# Patient Record
Sex: Female | Born: 1985 | Race: Black or African American | Hispanic: No | Marital: Single | Smoking: Never smoker
Health system: Southern US, Community
[De-identification: ages and names within clinical notes are randomized; demographics above are authoritative.]

## PROBLEM LIST (undated history)

## (undated) ENCOUNTER — Inpatient Hospital Stay (HOSPITAL_COMMUNITY): Payer: Self-pay

## (undated) DIAGNOSIS — O2340 Unspecified infection of urinary tract in pregnancy, unspecified trimester: Secondary | ICD-10-CM

## (undated) DIAGNOSIS — N83209 Unspecified ovarian cyst, unspecified side: Secondary | ICD-10-CM

## (undated) DIAGNOSIS — O24419 Gestational diabetes mellitus in pregnancy, unspecified control: Secondary | ICD-10-CM

## (undated) DIAGNOSIS — A749 Chlamydial infection, unspecified: Secondary | ICD-10-CM

## (undated) DIAGNOSIS — G43909 Migraine, unspecified, not intractable, without status migrainosus: Secondary | ICD-10-CM

## (undated) DIAGNOSIS — J302 Other seasonal allergic rhinitis: Secondary | ICD-10-CM

## (undated) DIAGNOSIS — K219 Gastro-esophageal reflux disease without esophagitis: Secondary | ICD-10-CM

## (undated) HISTORY — PX: NO PAST SURGERIES: SHX2092

## (undated) HISTORY — PX: WISDOM TOOTH EXTRACTION: SHX21

---

## 2005-08-29 ENCOUNTER — Emergency Department (HOSPITAL_COMMUNITY): Admission: EM | Admit: 2005-08-29 | Discharge: 2005-08-29 | Payer: Self-pay | Admitting: Family Medicine

## 2006-05-29 ENCOUNTER — Inpatient Hospital Stay (HOSPITAL_COMMUNITY): Admission: AD | Admit: 2006-05-29 | Discharge: 2006-05-29 | Payer: Self-pay | Admitting: Obstetrics and Gynecology

## 2006-05-30 ENCOUNTER — Inpatient Hospital Stay (HOSPITAL_COMMUNITY): Admission: AD | Admit: 2006-05-30 | Discharge: 2006-06-03 | Payer: Self-pay | Admitting: Obstetrics and Gynecology

## 2006-12-21 ENCOUNTER — Emergency Department (HOSPITAL_COMMUNITY): Admission: EM | Admit: 2006-12-21 | Discharge: 2006-12-21 | Payer: Self-pay | Admitting: Emergency Medicine

## 2007-03-02 ENCOUNTER — Emergency Department (HOSPITAL_COMMUNITY): Admission: EM | Admit: 2007-03-02 | Discharge: 2007-03-02 | Payer: Self-pay | Admitting: Family Medicine

## 2011-02-09 LAB — URINALYSIS, ROUTINE W REFLEX MICROSCOPIC
Glucose, UA: NEGATIVE
Urobilinogen, UA: 0.2

## 2011-02-09 LAB — URINE MICROSCOPIC-ADD ON

## 2011-10-14 ENCOUNTER — Inpatient Hospital Stay (HOSPITAL_COMMUNITY)
Admission: AD | Admit: 2011-10-14 | Discharge: 2011-10-14 | Disposition: A | Discharge status: Home / Self Care | Payer: Medicaid Other | Source: Ambulatory Visit | Attending: Obstetrics & Gynecology | Admitting: Obstetrics & Gynecology

## 2011-10-14 ENCOUNTER — Encounter (HOSPITAL_COMMUNITY): Payer: Self-pay

## 2011-10-14 ENCOUNTER — Inpatient Hospital Stay (HOSPITAL_COMMUNITY): Payer: Medicaid Other

## 2011-10-14 DIAGNOSIS — O468X9 Other antepartum hemorrhage, unspecified trimester: Secondary | ICD-10-CM

## 2011-10-14 DIAGNOSIS — Z3490 Encounter for supervision of normal pregnancy, unspecified, unspecified trimester: Secondary | ICD-10-CM

## 2011-10-14 DIAGNOSIS — R109 Unspecified abdominal pain: Secondary | ICD-10-CM | POA: Insufficient documentation

## 2011-10-14 DIAGNOSIS — O209 Hemorrhage in early pregnancy, unspecified: Secondary | ICD-10-CM | POA: Insufficient documentation

## 2011-10-14 DIAGNOSIS — Z1389 Encounter for screening for other disorder: Secondary | ICD-10-CM

## 2011-10-14 HISTORY — DX: Other seasonal allergic rhinitis: J30.2

## 2011-10-14 HISTORY — DX: Gastro-esophageal reflux disease without esophagitis: K21.9

## 2011-10-14 HISTORY — DX: Migraine, unspecified, not intractable, without status migrainosus: G43.909

## 2011-10-14 HISTORY — DX: Unspecified ovarian cyst, unspecified side: N83.209

## 2011-10-14 LAB — URINE MICROSCOPIC-ADD ON

## 2011-10-14 LAB — POCT PREGNANCY, URINE: Preg Test, Ur: POSITIVE — AB

## 2011-10-14 LAB — CBC
HCT: 34.9 % — ABNORMAL LOW (ref 36.0–46.0)
Hemoglobin: 11.7 g/dL — ABNORMAL LOW (ref 12.0–15.0)
MCHC: 33.5 g/dL (ref 30.0–36.0)
RBC: 4.51 MIL/uL (ref 3.87–5.11)
RDW: 15.9 % — ABNORMAL HIGH (ref 11.5–15.5)

## 2011-10-14 LAB — WET PREP, GENITAL
Clue Cells Wet Prep HPF POC: NONE SEEN
Yeast Wet Prep HPF POC: NONE SEEN

## 2011-10-14 LAB — DIFFERENTIAL
Basophils Absolute: 0 10*3/uL (ref 0.0–0.1)
Eosinophils Absolute: 0.1 10*3/uL (ref 0.0–0.7)
Lymphocytes Relative: 19 % (ref 12–46)
Lymphs Abs: 3.2 10*3/uL (ref 0.7–4.0)

## 2011-10-14 LAB — HCG, QUANTITATIVE, PREGNANCY: hCG, Beta Chain, Quant, S: 38419 m[IU]/mL — ABNORMAL HIGH (ref <5)

## 2011-10-14 LAB — ABO/RH: ABO/RH(D): AB POS

## 2011-10-14 LAB — URINALYSIS, ROUTINE W REFLEX MICROSCOPIC
Bilirubin Urine: NEGATIVE
Glucose, UA: NEGATIVE mg/dL
Ketones, ur: NEGATIVE mg/dL
Protein, ur: NEGATIVE mg/dL
Specific Gravity, Urine: 1.015 (ref 1.005–1.030)
pH: 7.5 (ref 5.0–8.0)

## 2011-10-14 NOTE — Discharge Instructions (Signed)
Vaginal Bleeding During Pregnancy A small amount of bleeding from the vagina can happen anytime during pregnancy. Be sure to tell your doctor about all vaginal bleeding.  HOME CARE  Get plenty of rest and sleep.   Count the number of pads you use each day. Do not use tampons.   Save any tissue you pass for your doctor to see.   Do not exercise   Do not do any heavy lifting.   Avoid going up and down stairs. If you must climb stairs, go slowly.   Do not have sex (intercourse) or orgasms until approved by your doctor.   Do not douche.   Only take medicine as told by your doctor. Do not take aspirin.   Eat healthy.   Always keep your follow-up appointments.  GET HELP RIGHT AWAY IF:   You feel the baby moving less or not moving at all.   The bleeding gets worse.   You have very painful cramps or pain in your stomach or back.   You pass large clots or anything that looks like tissue.   You have a temperature by mouth above 102 F (38.9 C).   You feel very weak.   You have chills.   You feel dizzy or pass out (faint).   You have a gush of fluid from the vagina.  MAKE SURE YOU:   Understand these instructions.   Will watch your condition.   Will get help right away if you are not doing well or get worse.  Document Released: 01/24/2008 Document Revised: 04/05/2011 Document Reviewed: 03/22/2009 ExitCare Patient Information 2012 [place].   ________________________________________     To schedule your Maternity Eligibility Appointment, please call [phone].  When you arrive for your appointment you must bring the following items or information listed below.  Your appointment will be rescheduled if you do not have these items or are 15 minutes late. If currently receiving Medicaid, you MUST bring: 1. Medicaid Card 2. Social Security Card 3. Picture ID 4. Proof of Pregnancy 5. Verification of current address if the address on Medicaid card is incorrect  "postmarked mail" If not receiving Medicaid, you MUST bring: 1. Social Security Card 2. Picture ID 3. Birth Certificate (if available) Passport or *Green Card 4. Proof of Pregnancy 5. Verification of current address "postmarked mail" for each income presented. 6. Verification of insurance coverage, if any 7. Check stubs from each employer for the previous month (if unable to present check stub  for each week, we will accept check stub for the first and last week ill the same month.) If you can't locate check stubs, you must bring a letter from the employer(s) and it must have the following information on letterhead, typed, in English: o name of company o company telephone number o how long been with the company, if less than one month o how much person earns per hour o how many hours per week work o the gross pay the person earned for the previous month If you are 28 years old or less, you do not have to bring proof of income unless you work or live with the father of the baby and at that time we will need proof of income from you and/or the father of the baby. Green Card recipients are eligible for Medicaid for Pregnant Women (MPW)   Prenatal Care Providers [facility]    [facility]  [phone]     Phone: [phone]  [facility]  @[place]     Phone: [phone]  Phone: [phone]         [facility]     Phone: [phone]  Phone: [phone]           [facility] [place]                hone: [phone]  Phone: [phone]         [facility] Dr. Francoise Ceo      Phone: [phone]  Phone: [phone]         [facility] Dept.                Phone: [phone]  [facility]   Phone:[phone]    [facility] [address] [place])          Phone:  [phone] [facility]   Phone: [phone]

## 2011-10-14 NOTE — MAU Provider Note (Signed)
History     CSN: [id number]  Arrival date & time 10/14/11  1744    Chief Complaint  Patient presents with  . Vaginal Bleeding  . Abdominal Pain   HPI 19:00 Sara Gibbs is a 26 y.o. female @ [redacted]w[redacted]d gestation who presents to MAU for abdominal pain and vaginal bleeding in early pregnancy. She has had some cramping for a few days but now bleeding and increased pain. The history was provided by the patient.  Past Medical History  Diagnosis Date  . GERD (gastroesophageal reflux disease)   . Ovarian cyst   . Migraines   . Seasonal allergies     Past Surgical History  Procedure Date  . Wisdom tooth extraction     Family History  Problem Relation Age of Onset  . Other Neg Hx     History  Substance Use Topics  . Smoking status: Never Smoker   . Smokeless tobacco: Not on file  . Alcohol Use: No    OB History    Grav Para Term Preterm Abortions TAB SAB Ect Mult Living   2 1        1       Review of Systems  Constitutional: Negative for fever and chills.  HENT: Negative.   Eyes: Negative.   Respiratory: Negative.   Cardiovascular: Negative.   Gastrointestinal: Positive for nausea, vomiting and abdominal pain. Negative for diarrhea and constipation.  Genitourinary: Positive for frequency, vaginal bleeding, vaginal discharge and pelvic pain. Negative for dysuria and urgency.  Musculoskeletal: Positive for back pain.  Neurological: Negative for dizziness and headaches.  Psychiatric/Behavioral: Negative for confusion.    Allergies  Review of patient's allergies indicates no known allergies.  Home Medications  No current outpatient prescriptions on file.  BP 114/72  Pulse 101  Temp 98.8 F (37.1 C) (Oral)  Resp 18  LMP 07/31/2011  Physical Exam  Nursing note and vitals reviewed. Constitutional: She is oriented to person, place, and time. She appears well-developed and well-nourished. No distress.  HENT:  Head: Normocephalic.  Eyes: EOM are normal.    Neck: Neck supple.  Cardiovascular:       Tachycardia   Pulmonary/Chest: Effort normal.  Abdominal: Soft. There is tenderness in the right lower quadrant and left lower quadrant. There is no rebound, no guarding and no CVA tenderness.  Genitourinary:       External genitalia without lesions, small blood vaginal vault. Cervix closed, long, positive CMT, bilateral adnexal tenderness right >left. Uterus slightly enlarged.  Musculoskeletal: Normal range of motion.  Neurological: She is alert and oriented to person, place, and time. No cranial nerve deficit.  Skin: Skin is warm and dry.  Psychiatric: Her behavior is normal. Judgment and thought content normal. Her mood appears anxious.   Assessment: Bleeding in early pregnancy  Plan:  Labs, ultrasound   Care turned over to Maylon Cos, NP @ 20:20 pm ED Course  Procedures    MDM   IUP [redacted]w[redacted]d With [facility] D/C home bleeding precautions Referrel for ob care

## 2011-10-14 NOTE — MAU Note (Signed)
Onset of small amount of vaginal bleeding with some abdominal pain  More today, LMP 07/31/11 normal period with one day of spotting one day in May

## 2011-10-15 NOTE — MAU Provider Note (Signed)
Medical Screening exam and patient care preformed by advanced practice provider.  Agree with the above management.  

## 2011-10-16 LAB — GC/CHLAMYDIA PROBE AMP, GENITAL: Chlamydia, DNA Probe: POSITIVE — AB

## 2011-10-17 ENCOUNTER — Telehealth: Payer: Self-pay

## 2011-10-18 ENCOUNTER — Telehealth (HOSPITAL_COMMUNITY): Payer: Self-pay | Admitting: *Deleted

## 2011-10-18 NOTE — Telephone Encounter (Signed)
Message copied by Pennie Banter on Thu Oct 18, 2011  1:25 PM ------      Message from: Lesly Dukes      Created: Tue Oct 16, 2011  2:24 PM       Pt needs to be treated and partner referred to [facility].

## 2011-10-18 NOTE — Telephone Encounter (Signed)
Telephone call to patient regarding positive chlamydia culture, patient not in, left message for her to call.  Patient has not been treated and will need referral to [place] [facility] at [phone].  Report faxed to health department.  

## 2011-10-31 NOTE — Telephone Encounter (Signed)
Opened in error

## 2012-01-10 LAB — OB RESULTS CONSOLE RUBELLA ANTIBODY, IGM: Rubella: IMMUNE

## 2012-01-10 LAB — OB RESULTS CONSOLE ABO/RH

## 2012-01-10 LAB — OB RESULTS CONSOLE HEPATITIS B SURFACE ANTIGEN: Hepatitis B Surface Ag: NEGATIVE

## 2012-01-10 LAB — OB RESULTS CONSOLE ANTIBODY SCREEN: Antibody Screen: NEGATIVE

## 2012-01-20 ENCOUNTER — Encounter (HOSPITAL_COMMUNITY): Payer: Self-pay | Admitting: *Deleted

## 2012-01-20 ENCOUNTER — Inpatient Hospital Stay (HOSPITAL_COMMUNITY): Payer: Medicaid Other

## 2012-01-20 ENCOUNTER — Inpatient Hospital Stay (HOSPITAL_COMMUNITY)
Admission: AD | Admit: 2012-01-20 | Discharge: 2012-01-20 | Disposition: A | Discharge status: Hospice | Payer: Medicaid Other | Source: Ambulatory Visit | Attending: Obstetrics and Gynecology | Admitting: Obstetrics and Gynecology

## 2012-01-20 DIAGNOSIS — R109 Unspecified abdominal pain: Secondary | ICD-10-CM | POA: Insufficient documentation

## 2012-01-20 DIAGNOSIS — O2390 Unspecified genitourinary tract infection in pregnancy, unspecified trimester: Secondary | ICD-10-CM | POA: Insufficient documentation

## 2012-01-20 DIAGNOSIS — N3090 Cystitis, unspecified without hematuria: Secondary | ICD-10-CM | POA: Insufficient documentation

## 2012-01-20 DIAGNOSIS — O26839 Pregnancy related renal disease, unspecified trimester: Secondary | ICD-10-CM

## 2012-01-20 DIAGNOSIS — O2300 Infections of kidney in pregnancy, unspecified trimester: Secondary | ICD-10-CM

## 2012-01-20 DIAGNOSIS — N289 Disorder of kidney and ureter, unspecified: Secondary | ICD-10-CM

## 2012-01-20 DIAGNOSIS — O99891 Other specified diseases and conditions complicating pregnancy: Secondary | ICD-10-CM | POA: Insufficient documentation

## 2012-01-20 DIAGNOSIS — N200 Calculus of kidney: Secondary | ICD-10-CM

## 2012-01-20 HISTORY — DX: Unspecified infection of urinary tract in pregnancy, unspecified trimester: O23.40

## 2012-01-20 LAB — CBC WITH DIFFERENTIAL/PLATELET
Eosinophils Relative: 0 % (ref 0–5)
HCT: 34.3 % — ABNORMAL LOW (ref 36.0–46.0)
Hemoglobin: 11.2 g/dL — ABNORMAL LOW (ref 12.0–15.0)
Lymphocytes Relative: 15 % (ref 12–46)
MCH: 25.1 pg — ABNORMAL LOW (ref 26.0–34.0)
MCHC: 32.7 g/dL (ref 30.0–36.0)
MCV: 76.9 fL — ABNORMAL LOW (ref 78.0–100.0)
Monocytes Relative: 9 % (ref 3–12)
Neutro Abs: 11.7 10*3/uL — ABNORMAL HIGH (ref 1.7–7.7)
Neutrophils Relative %: 75 % (ref 43–77)
Platelets: 307 10*3/uL (ref 150–400)
RBC: 4.46 MIL/uL (ref 3.87–5.11)

## 2012-01-20 LAB — URINALYSIS, ROUTINE W REFLEX MICROSCOPIC
Glucose, UA: NEGATIVE mg/dL
Ketones, ur: NEGATIVE mg/dL
Specific Gravity, Urine: 1.015 (ref 1.005–1.030)

## 2012-01-20 LAB — URINE MICROSCOPIC-ADD ON

## 2012-01-20 MED ORDER — CEPHALEXIN 500 MG PO CAPS: 500.0000 mg | ORAL_CAPSULE | Freq: Four times a day (QID) | ORAL | Status: DC

## 2012-01-20 MED ORDER — OXYCODONE-ACETAMINOPHEN 5-325 MG PO TABS: 1.0000 | ORAL_TABLET | ORAL | Status: DC | PRN

## 2012-01-20 MED ORDER — CEFTRIAXONE SODIUM 1 G IJ SOLR
1.0000 g | Freq: Once | INTRAMUSCULAR | Status: AC
  Administered 2012-01-20: 1 g via INTRAMUSCULAR
  Filled 2012-01-20: qty 10

## 2012-01-20 MED ORDER — TAMSULOSIN HCL 0.4 MG PO CAPS: 0.4000 mg | ORAL_CAPSULE | Freq: Every day | ORAL | Status: DC

## 2012-01-20 MED ORDER — MORPHINE SULFATE 4 MG/ML IJ SOLN
4.0000 mg | Freq: Once | INTRAMUSCULAR | Status: AC
  Administered 2012-01-20: 4 mg via INTRAMUSCULAR
  Filled 2012-01-20: qty 1

## 2012-01-20 NOTE — MAU Note (Signed)
Pt reports having pain in lower abd and ribs pain got worse today. Reports dx with bladder infection this week and has started antibiotics.

## 2012-01-20 NOTE — Progress Notes (Signed)
Sara Gibbs is  27 y.o. G2P1 at [redacted]w[redacted]d presents complaining of Abdominal Pain that started around 1400 today.  She states the pain is suprapubic and covers her upper abdomen on the lt and rt side. She describes it as being continuous but worsens with standing or walking. She has been on antibiotics for 2 days after being dx with a UTI. She reports +FM, and denies ctx, cramping, or ROM. She is seeking care with Dr. Shawnie Pons in [place].    Obstetrical/Gynecological History: Menstrual History: OB History    Grav Para Term Preterm Abortions TAB SAB Ect Mult Living   2 1        1       Patient's last menstrual period was 07/31/2011.     Past Medical History: Past Medical History  Diagnosis Date  . GERD (gastroesophageal reflux disease)   . Ovarian cyst   . Migraines   . Seasonal allergies   . UTI (urinary tract infection) during pregnancy     Past Surgical History: Past Surgical History  Procedure Date  . Wisdom tooth extraction     Family History: Family History  Problem Relation Age of Onset  . Other Neg Hx   . Diabetes Father   . Diabetes Paternal Grandmother     Social History: History  Substance Use Topics  . Smoking status: Never Smoker   . Smokeless tobacco: Not on file  . Alcohol Use: No    Allergies: No Known Allergies  Meds:  Prescriptions prior to admission  Medication Sig Dispense Refill  . acetaminophen (TYLENOL) 500 MG tablet Take 1,500 mg by mouth daily as needed. Head aches      . DM-Phenylephrine-Acetaminophen (TYLENOL COLD MULTI-SYMPTOM) 10-5-325 MG/15ML LIQD Take 15 mLs by mouth daily as needed. Cold symptoms      . loratadine (CLARITIN) 10 MG tablet Take 10 mg by mouth daily.      . nitrofurantoin, macrocrystal-monohydrate, (MACROBID) 100 MG capsule Take 100 mg by mouth 2 (two) times daily. Started 01/19/12 for 5 days      . Prenatal Vit-Fe Fumarate-FA (PRENATAL MULTIVITAMIN) TABS Take 1 tablet by mouth daily.        Review of Systems -  Please refer to the aforementioned patients' reports.     Physical Exam  Blood pressure 137/73, pulse 121, temperature 98.1 F (36.7 C), temperature source Oral, resp. rate 18, height 5' 3.5" (1.613 m), weight 94.348 kg (208 lb), last menstrual period 07/31/2011. GENERAL: Well-developed, well-nourished female in no acute distress.  LUNGS: Clear to auscultation bilaterally.  HEART: Regular rate and rhythm. ABDOMEN: Soft, RUQ & LUQ tender, nondistended, gravid. Lt CVAT  EXTREMITIES: Nontender, no edema, 2+ distal pulses. CERVICAL EXAM: Dilation: Closed Effacement (%): Thick Cervical Position: Posterior Presentation: Undeterminable Exam by:: Donette Larry, SNM Contractions: none  Labs: Recent Results (from the past 24 hour(s))  URINALYSIS, ROUTINE W REFLEX MICROSCOPIC   Collection Time   01/20/12  3:44 PM      Component Value Range   Color, Urine YELLOW  YELLOW   APPearance CLEAR  CLEAR   Specific Gravity, Urine 1.015  1.005 - 1.030   pH 6.0  5.0 - 8.0   Glucose, UA NEGATIVE  NEGATIVE mg/dL   Hgb urine dipstick TRACE (*) NEGATIVE   Bilirubin Urine NEGATIVE  NEGATIVE   Ketones, ur NEGATIVE  NEGATIVE mg/dL   Protein, ur NEGATIVE  NEGATIVE mg/dL   Urobilinogen, UA 0.2  0.0 - 1.0 mg/dL   Nitrite NEGATIVE  NEGATIVE  Leukocytes, UA TRACE (*) NEGATIVE  URINE MICROSCOPIC-ADD ON   Collection Time   01/20/12  3:44 PM      Component Value Range   Squamous Epithelial / LPF FEW (*) RARE   WBC, UA 3-6  <3 WBC/hpf   RBC / HPF 0-2  <3 RBC/hpf   Bacteria, UA RARE  RARE  CBC WITH DIFFERENTIAL   Collection Time   01/20/12  5:43 PM      Component Value Range   WBC 15.5 (*) 4.0 - 10.5 K/uL   RBC 4.46  3.87 - 5.11 MIL/uL   Hemoglobin 11.2 (*) 12.0 - 15.0 g/dL   HCT 08.6 (*) 57.8 - 46.9 %   MCV 76.9 (*) 78.0 - 100.0 fL   MCH 25.1 (*) 26.0 - 34.0 pg   MCHC 32.7  30.0 - 36.0 g/dL   RDW 62.9 (*) 52.8 - 41.3 %   Platelets 307  150 - 400 K/uL   Neutrophils Relative 75  43 - 77 %   Neutro  Abs 11.7 (*) 1.7 - 7.7 K/uL   Lymphocytes Relative 15  12 - 46 %   Lymphs Abs 2.4  0.7 - 4.0 K/uL   Monocytes Relative 9  3 - 12 %   Monocytes Absolute 1.3 (*) 0.1 - 1.0 K/uL   Eosinophils Relative 0  0 - 5 %   Eosinophils Absolute 0.1  0.0 - 0.7 K/uL   Basophils Relative 0  0 - 1 %   Basophils Absolute 0.0  0.0 - 0.1 K/uL   Imaging Studies:  No results found.  Assessment: IUP@[redacted]w[redacted]d  Abdominal pain, unknown etiology  Plan: Morphine for pain Renal and RUQ U/S  Lawernce Pitts 9/22/20138:45 PM  Assumed care of pt at 2000. Going to [place] now. Pain resolved. Still feels LUQ pressure.  IMAGING US Renal  01/20/2012  *RADIOLOGY REPORT*  Clinical Data: 20-week intrauterine pregnancy with abdominal pain.  RENAL/URINARY TRACT ULTRASOUND COMPLETE  Comparison:  None.  Findings:  Right Kidney:  13.3 cm.  Nonobstructing right inner polar renal collecting system calculus measuring 1 cm with "twinkle" artifact on color Doppler compatible with a nonobstructing calculus.  Mild dilation of the superior and inferior pole calyces compatible with hydronephrosis of pregnancy. Right ureteral jet visualized.  No obstructing pelvic or ureteral stone identified.  Left Kidney:  12.7 cm.  Multiple echogenic foci are present ranging in size from 5 mm to 8 mm compatible with collecting system calculi.  No pelvic or ureteral calculi are identified.  Left ureteral jet visualized. No dilation of the left renal collecting system.  Bladder:  Normal.  As above, both ureteral jets are visualized.  IMPRESSION:  1.  Bilateral nonobstructing renal collecting system calculi. 2.  Mild right hydronephrosis compatible with hydronephrosis of pregnancy.  Bilateral ureteral jets are visualized and no renal pelvis or proximal ureteral obstructing stones are identified.   Original Report Authenticated By: Andreas Newport, M.D.    US Abdomen Limited Ruq  01/20/2012  *RADIOLOGY REPORT*  Clinical Data:  20-week IUP with abdominal pain.   LIMITED ABDOMINAL ULTRASOUND - RIGHT UPPER QUADRANT  Comparison:  None.  Findings:  Gallbladder:  Normal gallbladder.  Gallbladder wall normal of 1.1 mm.  No sonographic Murphy's sign.  No stones or sludge.  Common bile duct:  2 mm, normal.  Liver:  Normal.  IMPRESSION: Normal right upper quadrant ultrasound.  No cholelithiasis or cholecystitis.  Original Report Authenticated By: Andreas Newport, M.D.    ASSESSMENT: 1. Non-obstructing kidney stone complicating pregnancy   2. Possible pyelonephritis complicating pregnancy, antepartum    PLAN: D/C home per consult w/ Dr. Emelda Fear Return to MAU or [facility] ED for fever, uncontrolled pain decreased urine output. Increase water intake Strain urine Follow-up Information    Follow up with Alm Bustard, MD. Call on 01/21/2012. (ask for referral to urology)       Follow up with [facility].   Contact information:   [address] [place] [address] [place] [address] [phone]           Medication List     As of 01/20/2012 11:04 PM    START taking these medications         cephALEXin 500 MG capsule   Commonly known as: KEFLEX   Take 1 capsule (500 mg total) by mouth 4 (four) times daily.      oxyCODONE-acetaminophen 5-325 MG per tablet   Commonly known as: PERCOCET/ROXICET   Take 1-2 tablets by mouth every 4 (four) hours as needed for pain.      Tamsulosin HCl 0.4 MG Caps   Commonly known as: FLOMAX   Take 1 capsule (0.4 mg total) by mouth daily.      CONTINUE taking these medications         acetaminophen 500 MG tablet   Commonly known as: TYLENOL      loratadine 10 MG tablet   Commonly known as: CLARITIN      prenatal multivitamin Tabs      TYLENOL COLD MULTI-SYMPTOM 10-5-325 MG/15ML Liqd   Generic drug: DM-Phenylephrine-Acetaminophen      STOP taking these medications         nitrofurantoin (macrocrystal-monohydrate) 100 MG capsule   Commonly known as: MACROBID          Where to get your  medications    These are the prescriptions that you need to pick up.   You may get these medications from any pharmacy.         cephALEXin 500 MG capsule   oxyCODONE-acetaminophen 5-325 MG per tablet   Tamsulosin HCl 0.4 MG Caps            [place], [place] 01/20/12 23:05

## 2012-01-20 NOTE — MAU Note (Signed)
Pt in tears with lower right abd pain for the past 1 1/2 hrs.  Says she woke up with this pain, denies diarrhea, vag bleeding or fever.

## 2012-03-01 ENCOUNTER — Encounter (HOSPITAL_COMMUNITY): Payer: Self-pay | Admitting: *Deleted

## 2012-03-01 ENCOUNTER — Inpatient Hospital Stay (HOSPITAL_COMMUNITY)
Admission: AD | Admit: 2012-03-01 | Discharge: 2012-03-01 | Disposition: A | Discharge status: Home / Self Care | Payer: Medicaid Other | Source: Ambulatory Visit | Attending: Family Medicine | Admitting: Family Medicine

## 2012-03-01 DIAGNOSIS — L02419 Cutaneous abscess of limb, unspecified: Secondary | ICD-10-CM | POA: Insufficient documentation

## 2012-03-01 DIAGNOSIS — L02429 Furuncle of limb, unspecified: Secondary | ICD-10-CM

## 2012-03-01 DIAGNOSIS — L03119 Cellulitis of unspecified part of limb: Secondary | ICD-10-CM | POA: Insufficient documentation

## 2012-03-01 DIAGNOSIS — O99891 Other specified diseases and conditions complicating pregnancy: Secondary | ICD-10-CM | POA: Insufficient documentation

## 2012-03-01 MED ORDER — SULFAMETHOXAZOLE-TRIMETHOPRIM 800-160 MG PO TABS: 1.0000 | ORAL_TABLET | Freq: Two times a day (BID) | ORAL | Status: DC

## 2012-03-01 MED ORDER — OXYCODONE-ACETAMINOPHEN 5-325 MG PO TABS: 1.0000 | ORAL_TABLET | ORAL | Status: DC | PRN

## 2012-03-01 MED ORDER — NALBUPHINE SYRINGE 5 MG/0.5 ML
10.0000 mg | INJECTION | Freq: Once | INTRAMUSCULAR | Status: AC
  Administered 2012-03-01: 10 mg via INTRAMUSCULAR
  Filled 2012-03-01: qty 1

## 2012-03-01 NOTE — MAU Note (Signed)
Pt reports having a boil on her left thigh that started on Monday and has gotten bigger and more painful.

## 2012-03-01 NOTE — MAU Provider Note (Signed)
History     CSN: [id number]  Arrival date and time: 03/01/12 1021   None     Chief Complaint  Patient presents with  . Cyst   HPI Sara Gibbs is a 26 y.o. G2P1 female at [redacted]w[redacted]d who presents with report of boil on Lt inner thigh since Tuesday.  Has been using warm compresses, but couldn't take pain any longer.  Had boil distal to this area approximately 1 month ago that eventually opened up and drained mod amount clear fluid.  Denies fever/chills, h/o MRSA.  Reports good fm.  In process of switching pnc providers to [facility]- needs to call for appt.   OB History    Grav Para Term Preterm Abortions TAB SAB Ect Mult Living   2 1        1       Past Medical History  Diagnosis Date  . GERD (gastroesophageal reflux disease)   . Ovarian cyst   . Migraines   . Seasonal allergies   . UTI (urinary tract infection) during pregnancy     Past Surgical History  Procedure Date  . Wisdom tooth extraction     Family History  Problem Relation Age of Onset  . Other Neg Hx   . Diabetes Father   . Diabetes Paternal Grandmother     History  Substance Use Topics  . Smoking status: Never Smoker   . Smokeless tobacco: Not on file  . Alcohol Use: No    Allergies: No Known Allergies  Prescriptions prior to admission  Medication Sig Dispense Refill  . flintstones complete (FLINTSTONES) 60 MG chewable tablet Chew 2 tablets by mouth every morning.        Review of Systems  Constitutional: Negative.  Negative for fever and chills.  HENT: Negative.   Eyes: Negative.   Respiratory: Negative.   Cardiovascular: Negative.   Gastrointestinal: Negative.   Genitourinary: Negative.   Musculoskeletal: Negative.   Skin:       Boil on Lt inner thigh since Tuesday- has been using warm compresses, couldn't take pain anymore  Neurological: Negative.   Endo/Heme/Allergies: Negative.   Psychiatric/Behavioral: Negative.    Physical Exam   Blood pressure 110/76, pulse 120, temperature 98.1  F (36.7 C), temperature source Oral, resp. rate 18, height 5' 3.5" (1.613 m), weight 99.066 kg (218 lb 6.4 oz), last menstrual period 07/31/2011.  Physical Exam  Constitutional: She is oriented to person, place, and time. She appears well-developed and well-nourished.  HENT:  Head: Normocephalic.  Eyes: Pupils are equal, round, and reactive to light.  Neck: Normal range of motion.  Cardiovascular: Normal rate and regular rhythm.   Respiratory: Effort normal and breath sounds normal.  GI: Soft.       gravid  Genitourinary:       deferred  Musculoskeletal: Normal range of motion.  Neurological: She is alert and oriented to person, place, and time.  Skin: Skin is warm and dry. There is erythema.       Large boil with 2 heads on Lt inner thigh surrounded by indurated area and erythema. Warm and very tender to touch  Psychiatric: She has a normal mood and affect. Her behavior is normal. Judgment and thought content normal.   FHR: 155, mod variability, 15x15accels, no decels=Cat I UCs: none  MAU Course  Procedures  EFM Nubain 10mg  IM x 1 I&D of boil: area cleansed w/ betadine, small incision made w/ tip of scalpel, large amount of mucopurulent drainage- assisted by n.  frazier, cnm   Sigal, Klose  Home Medication Instructions [id number]   Printed on:03/01/12 1153  Medication Information                    flintstones complete (FLINTSTONES) 60 MG chewable tablet Chew 2 tablets by mouth every morning.           oxyCODONE-acetaminophen (ROXICET) 5-325 MG per tablet Take 1 tablet by mouth every 4 (four) hours as needed for pain.           sulfamethoxazole-trimethoprim (BACTRIM DS) 800-160 MG per tablet Take 1 tablet by mouth 2 (two) times daily.            Follow-up Information    Follow up with [facility] Call on 03/03/2012. (to schedule your next appointment.  Return to hospital  as needed or  if symptoms worsen)    Contact information:   [address] [place] [address] [phone]         Assessment and Plan  A:  [redacted]w[redacted]d SIUP  Cat I FHR  Boil w/ localized cellulitis- now i&d'd   P:  D/C home  Continue warm compresses  Bactrim DS BID x 7d  Percocet prn pain  Call [facility] on Mon to schedule appt    Marge Duncans 03/01/2012, 11:13 AM

## 2012-03-01 NOTE — MAU Provider Note (Signed)
Chart reviewed and agree with management and plan.  

## 2012-03-09 ENCOUNTER — Inpatient Hospital Stay (HOSPITAL_COMMUNITY)
Admission: AD | Admit: 2012-03-09 | Discharge: 2012-03-09 | Disposition: A | Discharge status: Home / Self Care | Payer: Medicaid Other | Source: Ambulatory Visit | Attending: Obstetrics & Gynecology | Admitting: Obstetrics & Gynecology

## 2012-03-09 ENCOUNTER — Encounter (HOSPITAL_COMMUNITY): Payer: Self-pay | Admitting: *Deleted

## 2012-03-09 DIAGNOSIS — O219 Vomiting of pregnancy, unspecified: Secondary | ICD-10-CM

## 2012-03-09 DIAGNOSIS — N390 Urinary tract infection, site not specified: Secondary | ICD-10-CM | POA: Insufficient documentation

## 2012-03-09 DIAGNOSIS — O2390 Unspecified genitourinary tract infection in pregnancy, unspecified trimester: Secondary | ICD-10-CM | POA: Insufficient documentation

## 2012-03-09 DIAGNOSIS — O2340 Unspecified infection of urinary tract in pregnancy, unspecified trimester: Secondary | ICD-10-CM

## 2012-03-09 DIAGNOSIS — O0930 Supervision of pregnancy with insufficient antenatal care, unspecified trimester: Secondary | ICD-10-CM | POA: Insufficient documentation

## 2012-03-09 DIAGNOSIS — O212 Late vomiting of pregnancy: Secondary | ICD-10-CM | POA: Insufficient documentation

## 2012-03-09 DIAGNOSIS — O99019 Anemia complicating pregnancy, unspecified trimester: Secondary | ICD-10-CM | POA: Insufficient documentation

## 2012-03-09 DIAGNOSIS — D649 Anemia, unspecified: Secondary | ICD-10-CM | POA: Insufficient documentation

## 2012-03-09 LAB — URINALYSIS, ROUTINE W REFLEX MICROSCOPIC
Nitrite: NEGATIVE
Protein, ur: 30 mg/dL — AB
Urobilinogen, UA: 0.2 mg/dL (ref 0.0–1.0)

## 2012-03-09 LAB — COMPREHENSIVE METABOLIC PANEL
ALT: 48 U/L — ABNORMAL HIGH (ref 0–35)
AST: 58 U/L — ABNORMAL HIGH (ref 0–37)
CO2: 24 mEq/L (ref 19–32)
Calcium: 8.8 mg/dL (ref 8.4–10.5)
GFR calc non Af Amer: >90 mL/min (ref >90)
Sodium: 138 mEq/L (ref 135–145)
Total Protein: 5.6 g/dL — ABNORMAL LOW (ref 6.0–8.3)

## 2012-03-09 LAB — CBC
MCH: 24.4 pg — ABNORMAL LOW (ref 26.0–34.0)
Platelets: 248 10*3/uL (ref 150–400)
RBC: 4.3 MIL/uL (ref 3.87–5.11)

## 2012-03-09 LAB — URINE MICROSCOPIC-ADD ON

## 2012-03-09 MED ORDER — LACTATED RINGERS IV BOLUS (SEPSIS)
1000.0000 mL | Freq: Once | INTRAVENOUS | Status: AC
  Administered 2012-03-09: 1000 mL via INTRAVENOUS

## 2012-03-09 MED ORDER — NITROFURANTOIN MONOHYD MACRO 100 MG PO CAPS: 100.0000 mg | ORAL_CAPSULE | Freq: Two times a day (BID) | ORAL | Status: DC

## 2012-03-09 MED ORDER — PROMETHAZINE HCL 25 MG/ML IJ SOLN
25.0000 mg | Freq: Four times a day (QID) | INTRAMUSCULAR | Status: DC | PRN
  Administered 2012-03-09: 25 mg via INTRAVENOUS

## 2012-03-09 MED ORDER — PROMETHAZINE HCL 25 MG/ML IJ SOLN
12.5000 mg | Freq: Four times a day (QID) | INTRAMUSCULAR | Status: DC | PRN
  Filled 2012-03-09: qty 1

## 2012-03-09 MED ORDER — PROMETHAZINE HCL 25 MG PO TABS: 12.5000 mg | ORAL_TABLET | Freq: Four times a day (QID) | ORAL | Status: DC | PRN

## 2012-03-09 NOTE — MAU Provider Note (Signed)
History     CSN: [id number]  Arrival date and time: 03/09/12 1112   None    Chief Complaint  Patient presents with  . Emesis During Pregnancy  . Chills   HPI Patient is a 26 yo G2P1001 at [redacted]w[redacted]d presenting for N/V. Patient reports having nausea the entire pregnancy but has been unable to tolerate food or drink today and began having vomiting this morning as well. She has been seen once by OB in [facility], and has had a few visits to the MAU but no established prenatal care. (Has appointment at [facility] today.) Her last visit was November 2 and she has lost 5lbs since then. Denies diarrhea today, fevers, abdominal pain. Denies any sick contacts. Good fetal movement, no bleeding, no LOF. Denies any other source of infection.  OB History    Grav Para Term Preterm Abortions TAB SAB Ect Mult Living   2 1 1       1       Past Medical History  Diagnosis Date  . GERD (gastroesophageal reflux disease)   . Ovarian cyst   . Migraines   . Seasonal allergies   . UTI (urinary tract infection) during pregnancy     Past Surgical History  Procedure Date  . Wisdom tooth extraction     Family History  Problem Relation Age of Onset  . Other Neg Hx   . Diabetes Father   . Diabetes Paternal Grandmother   . Cancer Mother   . Cancer Maternal Grandmother     History  Substance Use Topics  . Smoking status: Never Smoker   . Smokeless tobacco: Not on file  . Alcohol Use: No    Allergies: No Known Allergies  Prescriptions prior to admission  Medication Sig Dispense Refill  . flintstones complete (FLINTSTONES) 60 MG chewable tablet Chew 2 tablets by mouth every morning.      Marland Kitchen oxyCODONE-acetaminophen (ROXICET) 5-325 MG per tablet Take 1 tablet by mouth every 4 (four) hours as needed for pain.  15 tablet  0  . sulfamethoxazole-trimethoprim (BACTRIM DS) 800-160 MG per tablet Take 1 tablet by mouth 2 (two) times daily.  14 tablet  0    Review of Systems  Constitutional: Positive  for chills. Negative for fever.  Respiratory: Negative for shortness of breath.   Cardiovascular: Negative for chest pain.  Gastrointestinal: Positive for nausea and vomiting. Negative for diarrhea.  Genitourinary: Negative for dysuria.  Musculoskeletal: Negative for back pain.  Skin: Negative for rash.  Neurological: Negative for dizziness and headaches.   Physical Exam   Blood pressure 127/85, pulse 124, temperature 97.8 F (36.6 C), temperature source Oral, resp. rate 20, height 5\' 3"  (1.6 m), weight 96.798 kg (213 lb 6.4 oz), last menstrual period 07/31/2011, SpO2 100.00%.  Physical Exam  Constitutional: She is oriented to person, place, and time. She appears well-developed and well-nourished.       Appears uncomfortable  HENT:  Head: Normocephalic and atraumatic.  Neck: Normal range of motion.  Cardiovascular: Normal rate and regular rhythm.   No murmur heard. Respiratory: Effort normal and breath sounds normal.  GI: Soft. There is no tenderness. There is no rebound.       Gravid. Toco in place.  Musculoskeletal: Normal range of motion.  Neurological: She is alert and oriented to person, place, and time. No cranial nerve deficit.  Skin: Skin is dry. No rash noted.    MAU Course  Procedures  FHT 160, moderate variability  No  contractions  MDM 12:00pm - Appears uncomfortable. Will give LR bolus and phenergan for vomiting 1:30pm - UA reviewed. See below. Appears to be UTI, will send for culture 2:00pm- Resting well. BP borderline low. Will give another bolus and check CBC, CMP. No uterine contractions noted. Able to tolerate crackers and juice. 3:00pm- Labs reviewed. Slight leukocytosis and anemia. Patient has UTI as well as possible viral cause of vomiting which could be contributing to elevated WBC. Electrolytes within normal limits.  Results for orders placed during the hospital encounter of 03/09/12 (from the past 24 hour(s))  URINALYSIS, ROUTINE W REFLEX MICROSCOPIC      Status: Abnormal   Collection Time   03/09/12 11:30 AM      Component Value Range   Color, Urine YELLOW  YELLOW   APPearance CLEAR  CLEAR   Specific Gravity, Urine 1.015  1.005 - 1.030   pH 7.0  5.0 - 8.0   Glucose, UA NEGATIVE  NEGATIVE mg/dL   Hgb urine dipstick SMALL (*) NEGATIVE   Bilirubin Urine NEGATIVE  NEGATIVE   Ketones, ur NEGATIVE  NEGATIVE mg/dL   Protein, ur 30 (*) NEGATIVE mg/dL   Urobilinogen, UA 0.2  0.0 - 1.0 mg/dL   Nitrite NEGATIVE  NEGATIVE   Leukocytes, UA MODERATE (*) NEGATIVE  URINE MICROSCOPIC-ADD ON     Status: Abnormal   Collection Time   03/09/12 11:30 AM      Component Value Range   Squamous Epithelial / LPF MANY (*) RARE   WBC, UA 11-20  <3 WBC/hpf   RBC / HPF 0-2  <3 RBC/hpf   Bacteria, UA FEW (*) RARE  CBC     Status: Abnormal   Collection Time   03/09/12  2:19 PM      Component Value Range   WBC 12.2 (*) 4.0 - 10.5 K/uL   RBC 4.30  3.87 - 5.11 MIL/uL   Hemoglobin 10.5 (*) 12.0 - 15.0 g/dL   HCT 29.5 (*) 28.4 - 13.2 %   MCV 75.8 (*) 78.0 - 100.0 fL   MCH 24.4 (*) 26.0 - 34.0 pg   MCHC 32.2  30.0 - 36.0 g/dL   RDW 44.0 (*) 10.2 - 72.5 %   Platelets 248  150 - 400 K/uL  COMPREHENSIVE METABOLIC PANEL     Status: Abnormal   Collection Time   03/09/12  2:19 PM      Component Value Range   Sodium 138  135 - 145 mEq/L   Potassium 4.0  3.5 - 5.1 mEq/L   Chloride 103  96 - 112 mEq/L   CO2 24  19 - 32 mEq/L   Glucose, Bld 100 (*) 70 - 99 mg/dL   BUN 9  6 - 23 mg/dL   Creatinine, Ser 3.66  0.50 - 1.10 mg/dL   Calcium 8.8  8.4 - 44.0 mg/dL   Total Protein 5.6 (*) 6.0 - 8.3 g/dL   Albumin 1.9 (*) 3.5 - 5.2 g/dL   AST 58 (*) 0 - 37 U/L   ALT 48 (*) 0 - 35 U/L   Alkaline Phosphatase 68  39 - 117 U/L   Total Bilirubin 0.2 (*) 0.3 - 1.2 mg/dL   GFR calc non Af Amer >90  >90 mL/min   GFR calc Af Amer >90  >90 mL/min    Assessment and Plan  26 yo G2P1001 at [redacted]w[redacted]d presenting with N/V  1. Vomiting 2. Limited prenatal care 3. UTI in  pregnancy  4. Leukocytosis 5. Anemia  -  Encourage to keep appointment at [facility] on 11/11/11 - Given Rx for phenergan to take as needed for nausea - Given Rx for Macrobid 100mg  BID x1 week for UTI. Urine culture sent, patient encouraged to ask about results at appointment this week - Encouraged to drink as much fluid as possible - Discharged home in stable condition  - Discussed with Sid Falcon, CNM  Sara Gibbs 03/09/2012, 11:58 AM

## 2012-03-09 NOTE — MAU Note (Signed)
Pt reports off and diarrhea through out pregnancy. Today pt feels "hot and then cold" vomiting today. Pt feels tired today and has no appetite.

## 2012-03-09 NOTE — MAU Note (Signed)
Patient states she has had nausea and vomiting for a while with this pregnancy but is worse today and not able to keep anything down. Having diarrhea for about one week, normal BM today. Denies any bleeding leaking and reports good fetal movement. Feels lightheaded and fatigue. Has seen Dr. Shawnie Pons in [facility] and has an appointment with the [facility] on Thursday.

## 2012-03-10 LAB — URINE CULTURE: Colony Count: 35000

## 2012-03-10 NOTE — MAU Provider Note (Signed)
I examined pt and agree with documentation above and resident plan of care. MUHAMMAD,WALIDAH  

## 2012-03-13 ENCOUNTER — Encounter: Payer: Self-pay | Admitting: Obstetrics & Gynecology

## 2012-03-13 ENCOUNTER — Ambulatory Visit (INDEPENDENT_AMBULATORY_CARE_PROVIDER_SITE_OTHER): Payer: Medicaid Other | Admitting: Obstetrics & Gynecology

## 2012-03-13 VITALS — BP 131/83 | Temp 98.3°F | Wt 219.9 lb

## 2012-03-13 DIAGNOSIS — O0930 Supervision of pregnancy with insufficient antenatal care, unspecified trimester: Secondary | ICD-10-CM

## 2012-03-13 DIAGNOSIS — Z3490 Encounter for supervision of normal pregnancy, unspecified, unspecified trimester: Secondary | ICD-10-CM

## 2012-03-13 DIAGNOSIS — Z23 Encounter for immunization: Secondary | ICD-10-CM

## 2012-03-13 DIAGNOSIS — R7989 Other specified abnormal findings of blood chemistry: Secondary | ICD-10-CM

## 2012-03-13 DIAGNOSIS — R7309 Other abnormal glucose: Secondary | ICD-10-CM

## 2012-03-13 DIAGNOSIS — O0990 Supervision of high risk pregnancy, unspecified, unspecified trimester: Secondary | ICD-10-CM | POA: Insufficient documentation

## 2012-03-13 DIAGNOSIS — K219 Gastro-esophageal reflux disease without esophagitis: Secondary | ICD-10-CM

## 2012-03-13 DIAGNOSIS — O24919 Unspecified diabetes mellitus in pregnancy, unspecified trimester: Secondary | ICD-10-CM | POA: Insufficient documentation

## 2012-03-13 LAB — POCT URINALYSIS DIP (DEVICE)
Hgb urine dipstick: NEGATIVE
Protein, ur: NEGATIVE mg/dL
Specific Gravity, Urine: 1.015 (ref 1.005–1.030)
Urobilinogen, UA: 0.2 mg/dL (ref 0.0–1.0)
pH: 7 (ref 5.0–8.0)

## 2012-03-13 LAB — RPR

## 2012-03-13 MED ORDER — TETANUS-DIPHTH-ACELL PERTUSSIS 5-2.5-18.5 LF-MCG/0.5 IM SUSP
0.5000 mL | Freq: Once | INTRAMUSCULAR | Status: AC
  Administered 2012-03-13: 0.5 mL via INTRAMUSCULAR

## 2012-03-13 MED ORDER — PANTOPRAZOLE SODIUM 40 MG PO TBEC: 40.0000 mg | DELAYED_RELEASE_TABLET | Freq: Every day | ORAL | Status: DC

## 2012-03-13 MED ORDER — INFLUENZA VIRUS VACC SPLIT PF IM SUSP
0.5000 mL | Freq: Once | INTRAMUSCULAR | Status: AC
  Administered 2012-03-13: 0.5 mL via INTRAMUSCULAR

## 2012-03-13 NOTE — Progress Notes (Signed)
Pulse- 137  Edema-feet, legs  Pressure/pain-lower abd Weight gain discussed 11-20lbs New ob packet given

## 2012-03-13 NOTE — Progress Notes (Signed)
Nutrition note: 1st visit consult Pt has gained 39.9# @ [redacted]w[redacted]d, which is > expected.  Pt reports eating 3 meals & 2-3 snacks/d. Snacks are often salad or popcorn. Pt reports drinking water & milk daily and a little gingerale. Pt reports having heartburn as well as N&V. Pt reports taking 2 chewable flintstones vitamins with iron, but these do not have some important nutrients such as zinc. Pt received verbal & written information on general nutrition during pregnancy and tips to decrease N&V and heartburn. Encouraged protein with all meals & snacks. Disc wt gain goals of 11-20# or 0.5#/wk. Encouraged pt to get 2 chewable complete multivitamins to ensure they have the recommended nutrients of a prenatal vitamin. Disc importance of BF & encouraged pt to at least pump some milk for baby. Pt agrees to get appropriate multivitamins and include protein with all meals & snacks. Pt does receive WIC in [facility]. Pt does not plan to BF. F/u if referred Blondell Reveal, MS, RD, LDN

## 2012-03-13 NOTE — Patient Instructions (Signed)
Heartburn During Pregnancy  Heartburn is a burning sensation in the chest caused by stomach acid backing up into the esophagus. Heartburn (also known as "reflux") is common in pregnancy because a certain hormone (progesterone) changes. The progesterone hormone may relax the valve that separates the esophagus from the stomach. This allows acid to go up into the esophagus, causing heartburn. Heartburn may also happen in pregnancy because the enlarging uterus pushes up on the stomach, which pushes more acid into the esophagus. This is especially true in the later stages of pregnancy. Heartburn problems usually go away after giving birth. CAUSES   The progesterone hormone.  Changing hormone levels.  The growing uterus that pushes stomach acid upward.  Large meals.  Certain foods and drinks.  Exercise.  Increased acid production. SYMPTOMS   Burning pain in the chest or lower throat.  Bitter taste in the mouth.  Coughing. DIAGNOSIS  Heartburn is typically diagnosed by your caregiver when taking a careful history of your concern. Your caregiver may order a blood test to check for a certain type of bacteria that is associated with heartburn. Sometimes, heartburn is diagnosed by prescribing a heartburn medicine to see if the symptoms improve. It is rare in pregnancy to have a procedure called an endoscopy. This is when a tube with a light and a camera on the end is used to examine the esophagus and the stomach. TREATMENT   Your caregiver may tell you to use certain over-the-counter medicines (antacids, acid reducers) for mild heartburn.  Your caregiver may prescribe medicines to decrease stomach acid or to protect your stomach lining.  Your caregiver may recommend certain diet changes.  For severe cases, your caregiver may recommend that the head of the bed be elevated on blocks. (Sleeping with more pillows is not an effective treatment as it only changes the position of your head and does  not improve the main problem of stomach acid refluxing into the esophagus.) HOME CARE INSTRUCTIONS   Take all medicines as directed by your caregiver.  Raise the head of your bed by putting blocks under the legs if instructed to by your caregiver.  Do not exercise right after eating.  Avoid eating 2 or 3 hours before bed. Do not lie down right after eating.  Eat small meals throughout the day instead of 3 large meals.  Identify foods and beverages that make your symptoms worse and avoid them. Foods you may want to avoid include:  Peppers.  Chocolate.  High-fat foods, including fried foods.  Spicy foods.  Garlic and onions.  Citrus fruits, including oranges, grapefruit, lemons, and limes.  Food containing tomatoes or tomato products.  Mint.  Carbonated and caffeinated drinks.  Vinegar. SEEK IMMEDIATE MEDICAL CARE IF:   You have severe chest pain that goes down your arm or into your jaw or neck.  You feel sweaty, dizzy, or lightheaded.  You become short of breath.  You vomit blood.  You have difficulty or pain with swallowing.  You have bloody or black, tarry stools.  You have episodes of heartburn more than 3 times a week, for more than 2 weeks. MAKE SURE YOU:  Understand these instructions.  Will watch your condition.  Will get help right away if you are not doing well or get worse. Document Released: 04/13/2000 Document Revised: 07/09/2011 Document Reviewed: 10/05/2010 Exit[employer] Patient Information 2013 [employer].  

## 2012-03-13 NOTE — Progress Notes (Signed)
   Subjective:    Sara Gibbs is a G2P1001 [redacted]w[redacted]d being seen today for her first obstetrical visit.  Her obstetrical history is significant for obesity and abnormal Hb A1c. Requested transfer from Dr. Shawnie Pons.. Patient does intend to breast feed. Pregnancy history fully reviewed.  Patient reports no complaints and recent treatment for UTI. Ceasar Mons Vitals:   03/13/12 0817  BP: 131/83  Temp: 98.3 F (36.8 C)  Weight: 219 lb 14.4 oz (99.746 kg)    HISTORY: OB History    Grav Para Term Preterm Abortions TAB SAB Ect Mult Living   2 1 1       1      # Outc Date GA Lbr Len/2nd Wgt Sex Del Anes PTL Lv   1 TRM 2/08 [redacted]w[redacted]d 08:00  F SVD EPI  Yes   2 CUR              Past Medical History  Diagnosis Date  . GERD (gastroesophageal reflux disease)   . Ovarian cyst   . Migraines   . Seasonal allergies   . UTI (urinary tract infection) during pregnancy    Past Surgical History  Procedure Date  . Wisdom tooth extraction    Family History  Problem Relation Age of Onset  . Other Neg Hx   . Diabetes Father   . Diabetes Paternal Grandmother   . Cancer Mother     ovarian  . Cancer Maternal Grandmother      Exam    Uterus:     Pelvic Exam:    Perineum:    Vulva:    Vagina:     pH:    Cervix: not checked   Adnexa: not evaluated   Bony Pelvis:   System: Breast:  not checked   Skin: normal coloration and turgor, no rashes    Neurologic: oriented, normal   Extremities: normal strength, tone, and muscle mass, no deformities   HEENT    Mouth/Teeth dental hygiene good   Neck supple   Cardiovascular:    Respiratory:  appears well, vitals normal, no respiratory distress, acyanotic, normal RR   Abdomen: soft, non-tender; bowel sounds normal; no masses,  no organomegaly   Urinary:       Assessment:    Pregnancy: G2P1001 Patient Active Problem List  Diagnosis  . GERD (gastroesophageal reflux disease)  . Elevated hemoglobin A1c  . Supervision of normal pregnancy         Plan:     Initial labs drawn. Prenatal vitamins. Problem list reviewed and updated. Genetic Screening discussed First Screen: not done. Had early [place].  Ultrasound discussed; fetal survey: results reviewed.  Follow up in 3 weeks. 50% of 30 min visit spent on counseling and coordination of care.  1 hr BS Protonix for GERD   ARNOLD,JAMES 03/13/2012

## 2012-03-17 ENCOUNTER — Encounter: Payer: Self-pay | Admitting: *Deleted

## 2012-03-17 DIAGNOSIS — O0930 Supervision of pregnancy with insufficient antenatal care, unspecified trimester: Secondary | ICD-10-CM | POA: Insufficient documentation

## 2012-03-31 ENCOUNTER — Inpatient Hospital Stay (HOSPITAL_COMMUNITY): Payer: Medicaid Other

## 2012-03-31 ENCOUNTER — Inpatient Hospital Stay (HOSPITAL_COMMUNITY)
Admission: AD | Admit: 2012-03-31 | Discharge: 2012-03-31 | Disposition: A | Discharge status: Home / Self Care | Payer: Medicaid Other | Source: Ambulatory Visit | Attending: Obstetrics & Gynecology | Admitting: Obstetrics & Gynecology

## 2012-03-31 ENCOUNTER — Encounter (HOSPITAL_COMMUNITY): Payer: Self-pay

## 2012-03-31 DIAGNOSIS — B9689 Other specified bacterial agents as the cause of diseases classified elsewhere: Secondary | ICD-10-CM | POA: Insufficient documentation

## 2012-03-31 DIAGNOSIS — R109 Unspecified abdominal pain: Secondary | ICD-10-CM | POA: Insufficient documentation

## 2012-03-31 DIAGNOSIS — N949 Unspecified condition associated with female genital organs and menstrual cycle: Secondary | ICD-10-CM | POA: Insufficient documentation

## 2012-03-31 DIAGNOSIS — N760 Acute vaginitis: Secondary | ICD-10-CM | POA: Insufficient documentation

## 2012-03-31 DIAGNOSIS — N390 Urinary tract infection, site not specified: Secondary | ICD-10-CM | POA: Insufficient documentation

## 2012-03-31 DIAGNOSIS — O2390 Unspecified genitourinary tract infection in pregnancy, unspecified trimester: Secondary | ICD-10-CM | POA: Insufficient documentation

## 2012-03-31 DIAGNOSIS — A499 Bacterial infection, unspecified: Secondary | ICD-10-CM | POA: Insufficient documentation

## 2012-03-31 DIAGNOSIS — O26899 Other specified pregnancy related conditions, unspecified trimester: Secondary | ICD-10-CM

## 2012-03-31 DIAGNOSIS — N200 Calculus of kidney: Secondary | ICD-10-CM

## 2012-03-31 DIAGNOSIS — R319 Hematuria, unspecified: Secondary | ICD-10-CM

## 2012-03-31 DIAGNOSIS — M549 Dorsalgia, unspecified: Secondary | ICD-10-CM | POA: Insufficient documentation

## 2012-03-31 HISTORY — DX: Chlamydial infection, unspecified: A74.9

## 2012-03-31 LAB — WET PREP, GENITAL: Yeast Wet Prep HPF POC: NONE SEEN

## 2012-03-31 LAB — URINALYSIS, ROUTINE W REFLEX MICROSCOPIC
Bilirubin Urine: NEGATIVE
Nitrite: NEGATIVE
Protein, ur: NEGATIVE mg/dL
Specific Gravity, Urine: 1.01 (ref 1.005–1.030)
Urobilinogen, UA: 0.2 mg/dL (ref 0.0–1.0)

## 2012-03-31 LAB — URINE MICROSCOPIC-ADD ON

## 2012-03-31 MED ORDER — FLUCONAZOLE 150 MG PO TABS: 150.0000 mg | ORAL_TABLET | Freq: Once | ORAL | Status: DC

## 2012-03-31 MED ORDER — METRONIDAZOLE 500 MG PO TABS: 500.0000 mg | ORAL_TABLET | Freq: Two times a day (BID) | ORAL | Status: DC

## 2012-03-31 MED ORDER — CYCLOBENZAPRINE HCL 10 MG PO TABS: 10.0000 mg | ORAL_TABLET | Freq: Three times a day (TID) | ORAL | Status: DC | PRN

## 2012-03-31 MED ORDER — COMFORT FIT MATERNITY SUPP LG MISC: 1.0000 [IU] | Status: DC | PRN

## 2012-03-31 MED ORDER — CYCLOBENZAPRINE HCL 10 MG PO TABS
10.0000 mg | ORAL_TABLET | Freq: Once | ORAL | Status: AC
  Administered 2012-03-31: 10 mg via ORAL
  Filled 2012-03-31: qty 1

## 2012-03-31 NOTE — Progress Notes (Signed)
D. Poe cnm notified of patient.

## 2012-03-31 NOTE — MAU Provider Note (Signed)
History     CSN: [id number]  Arrival date and time: 03/31/12 4098   First Provider Initiated Contact with Patient 03/31/12 1012      No chief complaint on file.  HPI  Sara Gibbs is a 26 y.o. G2P1001 at [redacted]w[redacted]d presenting with abdominal cramping and back pain that started this morning. She had an appointment at the [facility] this morning for diabetes education but came straight here after checking in. The abdominal cramping is 9/10 and the back pain is intermittent and stabbing, predominately on the right side. She has oxycodone at home for pain relief but has not taken it because it makes her drowsy and does not help. She had a BM this morning.    Past Medical History  Diagnosis Date  . GERD (gastroesophageal reflux disease)   . Ovarian cyst   . Migraines   . Seasonal allergies   . UTI (urinary tract infection) during pregnancy   . Chlamydia     Past Surgical History  Procedure Date  . Wisdom tooth extraction     Family History  Problem Relation Age of Onset  . Other Neg Hx   . Diabetes Father   . Diabetes Paternal Grandmother   . Cancer Mother     ovarian  . Cancer Maternal Grandmother     History  Substance Use Topics  . Smoking status: Never Smoker   . Smokeless tobacco: Never Used  . Alcohol Use: No    Allergies: No Known Allergies  Prescriptions prior to admission  Medication Sig Dispense Refill  . flintstones complete (FLINTSTONES) 60 MG chewable tablet Chew 2 tablets by mouth every morning.      . pantoprazole (PROTONIX) 40 MG tablet Take 1 tablet (40 mg total) by mouth daily.  30 tablet  3  . promethazine (PHENERGAN) 25 MG tablet Take 0.5-1 tablets (12.5-25 mg total) by mouth every 6 (six) hours as needed for nausea.  20 tablet  0    Review of Systems  Constitutional: Negative for fever and chills.  Cardiovascular: Negative for chest pain and palpitations.  Gastrointestinal: Positive for nausea and abdominal pain. Negative for vomiting and diarrhea.   Genitourinary: Negative for dysuria, urgency, frequency, hematuria and flank pain.  Musculoskeletal: Positive for back pain.  Neurological: Negative for headaches.    Physical Exam   Blood pressure 109/70, pulse 126, temperature 97.5 F (36.4 C), temperature source Oral, resp. rate 18, height 5\' 4"  (1.626 m), weight 101.515 kg (223 lb 12.8 oz), last menstrual period 07/31/2011.  Physical Exam  Constitutional: She is oriented to person, place, and time. She appears well-developed and well-nourished. Distressed: moaning and crying during H&P.  Cardiovascular: Normal rate, regular rhythm and normal heart sounds.   Respiratory: Effort normal and breath sounds normal.  GI: Bowel sounds are normal. There is tenderness in the right lower quadrant, suprapubic area and left lower quadrant. There is no rebound, no guarding and no CVA tenderness.  Genitourinary: Cervix exhibits no motion tenderness and no friability. No bleeding around the vagina. Vaginal discharge (white, frothy, copious) found.  Musculoskeletal:       TTP over the right flank, (-) CVA tenderness   Neurological: She is alert and oriented to person, place, and time.  Skin: Skin is warm and dry. She is not diaphoretic.  Psychiatric: She has a normal mood and affect. Her behavior is normal.    MAU Course  Procedures  MDM  Results for orders placed during the hospital encounter of 03/31/12 (from  the past 24 hour(s))  URINALYSIS, ROUTINE W REFLEX MICROSCOPIC     Status: Abnormal   Collection Time   03/31/12  9:10 AM      Component Value Range   Color, Urine YELLOW  YELLOW   APPearance CLEAR  CLEAR   Specific Gravity, Urine 1.010  1.005 - 1.030   pH 7.0  5.0 - 8.0   Glucose, UA NEGATIVE  NEGATIVE mg/dL   Hgb urine dipstick LARGE (*) NEGATIVE   Bilirubin Urine NEGATIVE  NEGATIVE   Ketones, ur 15 (*) NEGATIVE mg/dL   Protein, ur NEGATIVE  NEGATIVE mg/dL   Urobilinogen, UA 0.2  0.0 - 1.0 mg/dL   Nitrite NEGATIVE  NEGATIVE    Leukocytes, UA SMALL (*) NEGATIVE  URINE MICROSCOPIC-ADD ON     Status: Normal   Collection Time   03/31/12  9:10 AM      Component Value Range   Squamous Epithelial / LPF RARE  RARE   WBC, UA 3-6  <3 WBC/hpf   RBC / HPF 11-20  <3 RBC/hpf   Bacteria, UA RARE  RARE  WET PREP, GENITAL     Status: Abnormal   Collection Time   03/31/12 10:13 AM      Component Value Range   Yeast Wet Prep HPF POC NONE SEEN  NONE SEEN   Trich, Wet Prep NONE SEEN  NONE SEEN   Clue Cells Wet Prep HPF POC RARE (*) NONE SEEN   WBC, Wet Prep HPF POC FEW (*) NONE SEEN   *RADIOLOGY REPORT*  Clinical Data: Right flank pain, hematuria. [redacted] weeks pregnant.  RENAL/URINARY TRACT ULTRASOUND COMPLETE  Comparison: Abdominal ultrasound 01/20/2012  Findings:  Right Kidney: 14.4 cm in length. Calcification in the mid pole  region is 0.9 x 1.0 x 0.9 cm. There is mild to moderate  hydronephrosis, increased since prior study.  Left Kidney: 13.0 cm in length. Small calcification is seen in the  upper pole region, 0.6 x 0.6 x 0.8 cm. No pelvicaliectasis.  Bladder: Normal in appearance. There is a small amount of debris  within the bladder. Bilateral ureteral jets are present.  IMPRESSION:  1. There is mild to moderate right-sided hydronephrosis. In the  setting of pregnancy, differential diagnosis includes  hydronephrosis of pregnancy or obstruction because of stones.  2. Presence of bilateral ureteral jets is reassuring, excluding  complete obstruction.  Original Report Authenticated By: Norva Pavlov, M.D  Assessment and Plan  A: Abdominal cramping and back pain, improved with flexeril     Copious amount of white, frothy vaginal discharge      Recurrent UTI with possible kidney stones, no obstruction      GC/Chlamydia and urine culture pending       P: Pelvic support brace for abdominal cramping     Metronidazole 500 mg bid x7 days for suspected BV     Fluconazole 150 mg x1 for suspected yeast infection      Flexeril 10 mg to relieve abdominal cramping     Referral to urology for further assessment of kidney stones     F/U in [facility] for scheduled appt on Thursday  Corky Downs 03/31/2012, 10:19 AM   I saw and examined patient along with student and agree with above note.   Brigitt Mcclish 04/01/2012 10:03 PM

## 2012-03-31 NOTE — MAU Note (Signed)
Patient states that she was sent from the clinic due to c/o intermittent lower back pain, abdominal cramping and constant pelvic pressure since 2am. She denies any vaginal bleeding or lof or recent intercourse.

## 2012-04-01 ENCOUNTER — Telehealth: Payer: Self-pay | Admitting: Medical

## 2012-04-01 LAB — GC/CHLAMYDIA PROBE AMP
CT Probe RNA: NEGATIVE
GC Probe RNA: NEGATIVE

## 2012-04-01 NOTE — Telephone Encounter (Signed)
Message copied by Freddi Starr on Tue Apr 01, 2012 10:47 AM ------      Message from: Archie Patten      Created: Mon Mar 31, 2012 12:22 PM      Regarding: Referral to urology       Please make ambulatory urology referral for this patient. She appears to have kidney stones. She has f/u in the GYN clinic this Thursday.             Thanks,      Dorene Grebe

## 2012-04-01 NOTE — Telephone Encounter (Signed)
Sent referral form to [facility] with office notes and lab results. They will contact patient to schedule.

## 2012-04-03 ENCOUNTER — Other Ambulatory Visit: Payer: Self-pay | Admitting: Obstetrics & Gynecology

## 2012-04-03 ENCOUNTER — Encounter: Payer: Self-pay | Admitting: Obstetrics & Gynecology

## 2012-04-03 ENCOUNTER — Ambulatory Visit (INDEPENDENT_AMBULATORY_CARE_PROVIDER_SITE_OTHER): Payer: Medicaid Other | Admitting: Obstetrics & Gynecology

## 2012-04-03 VITALS — BP 124/79 | Wt 225.5 lb

## 2012-04-03 DIAGNOSIS — O99810 Abnormal glucose complicating pregnancy: Secondary | ICD-10-CM

## 2012-04-03 DIAGNOSIS — Z3490 Encounter for supervision of normal pregnancy, unspecified, unspecified trimester: Secondary | ICD-10-CM

## 2012-04-03 DIAGNOSIS — D571 Sickle-cell disease without crisis: Secondary | ICD-10-CM

## 2012-04-03 DIAGNOSIS — D573 Sickle-cell trait: Secondary | ICD-10-CM

## 2012-04-03 DIAGNOSIS — Z3689 Encounter for other specified antenatal screening: Secondary | ICD-10-CM

## 2012-04-03 DIAGNOSIS — Z8041 Family history of malignant neoplasm of ovary: Secondary | ICD-10-CM | POA: Insufficient documentation

## 2012-04-03 LAB — POCT URINALYSIS DIP (DEVICE)
Bilirubin Urine: NEGATIVE
Protein, ur: NEGATIVE mg/dL
Specific Gravity, Urine: 1.015 (ref 1.005–1.030)

## 2012-04-03 LAB — GLUCOSE, CAPILLARY: Glucose-Capillary: 274 mg/dL — ABNORMAL HIGH (ref 70–99)

## 2012-04-03 MED ORDER — GLYBURIDE 2.5 MG PO TABS: 2.5000 mg | ORAL_TABLET | Freq: Two times a day (BID) | ORAL | Status: DC

## 2012-04-03 NOTE — Progress Notes (Signed)
P = 134    Pt seen @ MAU for back pain, has multiple physical c/o today. She missed her appt w/Maggie for Diabetes teaching because of going to MAU.  1hr GTT was 330 on 03/13/12

## 2012-04-03 NOTE — Progress Notes (Signed)
NST reactive today.

## 2012-04-03 NOTE — Progress Notes (Signed)
Unable to find a Urology practice that takes pregnancy Medicaid. Patient wants to defer this until after delivery once she has [employer] as she cannot afford to pay out of pocket. Appointment scheduled with MFM 04/04/12 at 10 am for U/S and referral for sickle cell.

## 2012-04-03 NOTE — Progress Notes (Signed)
CBG 274 today.  Pt missed appt with diabetes management to learn how to check CBGs.  Pt states she is following the diet that the dietician gave her.  Will start glyburide 2.5 mg bid.  Pt given symptoms of hypoglycemia but doubt this will be a problem.  Pt still needs to see urology for kidney stones.  No evidence of infection today.  Pt needs anatomy scan and MFM consult.  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  Pt wants to be "mentally prepared" if the baby has sickle cell disease.  Pt warned of fetal death with uncontrolled blood sugar.  Pt drinks a lot of ginger ale.  Pt encouraged to switch to diet ginger ale.  Will start 2x weeks testing since uncontrolled hyperglycemia.

## 2012-04-04 ENCOUNTER — Encounter (HOSPITAL_COMMUNITY): Payer: Self-pay

## 2012-04-04 ENCOUNTER — Ambulatory Visit (HOSPITAL_COMMUNITY): Payer: Medicaid Other

## 2012-04-04 ENCOUNTER — Ambulatory Visit (HOSPITAL_COMMUNITY)
Admission: RE | Admit: 2012-04-04 | Discharge: 2012-04-04 | Disposition: A | Discharge status: Home / Self Care | Payer: Medicaid Other | Source: Ambulatory Visit | Attending: Obstetrics & Gynecology | Admitting: Obstetrics & Gynecology

## 2012-04-04 ENCOUNTER — Other Ambulatory Visit: Payer: Self-pay

## 2012-04-04 ENCOUNTER — Other Ambulatory Visit: Payer: Self-pay | Admitting: Obstetrics & Gynecology

## 2012-04-04 VITALS — BP 135/79 | HR 125 | Wt 225.0 lb

## 2012-04-04 DIAGNOSIS — O358XX0 Maternal care for other (suspected) fetal abnormality and damage, not applicable or unspecified: Secondary | ICD-10-CM | POA: Insufficient documentation

## 2012-04-04 DIAGNOSIS — D571 Sickle-cell disease without crisis: Secondary | ICD-10-CM

## 2012-04-04 DIAGNOSIS — O0930 Supervision of pregnancy with insufficient antenatal care, unspecified trimester: Secondary | ICD-10-CM

## 2012-04-04 DIAGNOSIS — Z363 Encounter for antenatal screening for malformations: Secondary | ICD-10-CM | POA: Insufficient documentation

## 2012-04-04 DIAGNOSIS — O352XX0 Maternal care for (suspected) hereditary disease in fetus, not applicable or unspecified: Secondary | ICD-10-CM | POA: Insufficient documentation

## 2012-04-04 DIAGNOSIS — Z3689 Encounter for other specified antenatal screening: Secondary | ICD-10-CM

## 2012-04-04 DIAGNOSIS — Z1389 Encounter for screening for other disorder: Secondary | ICD-10-CM | POA: Insufficient documentation

## 2012-04-04 DIAGNOSIS — O99810 Abnormal glucose complicating pregnancy: Secondary | ICD-10-CM

## 2012-04-04 NOTE — Progress Notes (Signed)
Ms. Joos was seen for ultrasound appointment today.  Please see [facility] report for details.

## 2012-04-07 ENCOUNTER — Encounter (HOSPITAL_COMMUNITY): Payer: Self-pay

## 2012-04-07 ENCOUNTER — Encounter: Payer: Medicaid Other | Attending: Obstetrics & Gynecology | Admitting: Dietician

## 2012-04-07 ENCOUNTER — Encounter: Payer: Self-pay | Admitting: Family Medicine

## 2012-04-07 ENCOUNTER — Ambulatory Visit (INDEPENDENT_AMBULATORY_CARE_PROVIDER_SITE_OTHER): Payer: Medicaid Other | Admitting: Family Medicine

## 2012-04-07 VITALS — BP 126/78 | Temp 98.7°F | Wt 224.5 lb

## 2012-04-07 DIAGNOSIS — O99810 Abnormal glucose complicating pregnancy: Secondary | ICD-10-CM

## 2012-04-07 DIAGNOSIS — Z713 Dietary counseling and surveillance: Secondary | ICD-10-CM | POA: Insufficient documentation

## 2012-04-07 LAB — POCT URINALYSIS DIP (DEVICE)
Bilirubin Urine: NEGATIVE
Ketones, ur: 15 mg/dL — AB
Protein, ur: NEGATIVE mg/dL
Specific Gravity, Urine: 1.015 (ref 1.005–1.030)

## 2012-04-07 MED ORDER — GLUCOSE BLOOD VI STRP: ORAL_STRIP | Status: DC

## 2012-04-07 MED ORDER — ACCU-CHEK FASTCLIX LANCETS MISC: 1.0000 [IU] | Freq: Four times a day (QID) | Status: DC

## 2012-04-07 NOTE — Progress Notes (Signed)
Diabetes Education:  Seen for first time today.  Comes to me crying and relating that the pregnancy has been stressful and "if I'm going to have a still born baby, why don't they just go ahead and deliver it."  After the tearing, I reviewed the GDM diet briefly and the need for regular meals and snacks with protein and counting the carbs.  Provided an Accu Chek Smartview meter kit. [id number] Exp: 03/29/2013.  On return demonstration, her blood glucose 3 hr after breakfast was 110 mg/dl.  Provided the handout "Nutrition, Diabetes and Pregnancy" and Carb Counting handout.  Will need to see her again next week.  Maggie May, RN, RD, CDE

## 2012-04-07 NOTE — Progress Notes (Signed)
NST reviewed and reactive. S/p amnio for Sickle status of baby. To see nutrition today. Already on Glyburide 2.5 mg she is feeling tired.  Has no BS for checking. U/s shows LGA baby at > 90%-very tearful and worried about her baby. Strips and lancets given.

## 2012-04-07 NOTE — Progress Notes (Signed)
Genetic Counseling  High-Risk Gestation Note  Appointment Date:  04/04/2012 Referred By: Lesly Dukes, MD Date of Birth:  01-Jan-1986    Pregnancy History: G2P1001 Estimated Date of Delivery: 06/05/12 Estimated Gestational Age: [redacted]w[redacted]d Attending: Rema Fendt, MD   Ms. Sara Gibbs was seen for genetic counseling given that both she and the father of the pregnancy reportedly have sickle cell trait. The patient was accompanied by the father of the pregnancy's mother at the time of today's visit.   Both family histories were reviewed and found to be contributory for sickle cell trait. The patient reported that she, her father, and paternal half-sister have sickle cell trait. The patient reported that her 14 year-old daughter, with a previous partner, also has sickle cell trait, determined by amniocentesis during that pregnancy. We do not currently have laboratory report(s) confirming sickle cell trait in the patient. Additionally, the father of the pregnancy, Sara Gibbs, reportedly has sickle cell trait, diagnosed from newborn screening. The patient reported that he recently pursued confirmatory testing through [facility] [place], which identified hemoglobin AS (sickle cell trait). This laboratory report was not available to [place] at the time of today's visit for confirmation of this verbal report. Additionally, Sara's 3 sisters and one brother also have sickle cell trait, as does his father. No individuals with sickle cell disease were reported for either family history.   We discussed that Sickle Cell anemia (SCA) is a hemoglobinopathy in which there is an inherited structural abnormality in one of the globin chains, specifically the beta globin chain.  It is characterized by episodes of vaso-occlusive crisis and chronic anemia due to the tendency of red blood cells to become deformed under conditions of decreased oxygen tension.  The basic defect in SCA involves the change  of a single amino acid altering the configuration of the hemoglobin molecule causing the cells to sickle and to obstruct blood flow in small vessels.  Ischemia of tissues and organs results.  Increased cell fragility with increased phagocytosis and splenic sequestration of the fragile cells produces anemia.  SCA (Hgb SS) is inherited as an autosomal recessive condition.  The carrier state is Hgb AS.  Ms. Sara Gibbs was counseled about the 1 in 4 (25%) chance with each pregnancy to have a child with SCA given that she and the father of the baby are reportedly both carriers for SCA (Hgb AS).  With each pregnancy together, there is also a 1 in 4 (25%) chance to have a child who is not a carrier, or affected with SCA (Hgb AA) and a 1 in 2 (50%) chance to have a child who is a carrier for SCA (Hgb AS), like the parents.  We reviewed that early diagnosis of SCA is facilitated by newborn screening before the onset of symptoms.  Clinical manifestations usually present in the first or second year of life.  We discussed the option of amniocentesis as a way to determine sickle cell status for a pregnancy.  A risk of 1 in 300-500 was given for complications associated with amniocentesis, the primary complication being spontaneous pregnancy loss, or at this late gestation, preterm delivery.  She understands that although the ultrasound may appear normal, the risk of anomalies cannot be completely eliminated, and that a baby with SCA would not appear different on a prenatal ultrasound. After careful consideration, she elected to proceed with amniocentesis at the time of today's visit for sickle cell testing in the pregnancy. The patient declined  chromosome analysis from amniocentesis given that the pregnancy is not expected to be at increased risk for fetal aneuploidy based on age alone. Targeted ultrasound was also performed at the time of today's visit. Ultrasound was limited due to maternal body habitus and fetal  position. Complete ultrasound results reported separately. Follow-up ultrasound was recommended to be offered in 4 weeks. No follow-up appointments were scheduled for our office.   Additionally in the family history, the patient reported a learning delay in math for her sister. She also reported speech delay for her 28 year-old maternal half-nephew (her maternal half-brother's son). He is reportedly 38 years old and not talking yet. An underlying etiology for the learning disability and speech delay are not known. We discussed that there can multiple causes for learning disability and speech delay, including genetics, environment, and multifactorial inheritance. Additional information is needed regarding the underlying cause in order to assess recurrence risk for relatives.   Ms. Sara Gibbs also reported that her maternal half-brother has one kidney, discovered when he was hospitalized at one point. He is otherwise healthy. Renal agenesis (absent kidney) occurs in approximately 1 in 1000 births. It can be isolated or occur as part of an underlying condition. Unilateral renal agenesis may be asymptomatic and discovered incidentally, like Ms. Sara Gibbs's brother. Typically renal agenesis occurs sporadically, though some familial cases have been reported.  Thus, renal ultrasounds for relatives may be offered. From Ms. Sara Gibbs's  report, her brother's absent kidney appears to be isolated. Thus, given the reported family history, the recurrence risk for the current pregnancy is likely low, but could be increased above the general population risk.   The patient also reported a history of ovarian cancer for her mother and a maternal aunt with breast cancer, diagnosed at age 57 years. Though most cancers are thought to be sporadic or due to environmental factors, some families appear to have a strong predisposition to cancers.  When considering a family history of cancer, we look for common types of  cancer in multiple family members occurring at younger than typical ages. We discussed the option of meeting with a cancer genetic counselor to discuss any possible screening or testing options available. Sara Gibbs stated that her mother's physicians in [place] have discussed possibly pursuing genetic testing given this history. If Sara Gibbs is concerned about the family history of cancer and would like to learn more about the family's chance for an inherited cancer syndrome, her doctor may refer her or her relatives to [facility] [phone]). Without further information regarding the provided family history, an accurate genetic risk cannot be calculated. Further genetic counseling is warranted if more information is obtained.     Sara Gibbs denied exposure to environmental toxins or chemical agents. She denied the use of alcohol, tobacco or street drugs. She denied significant viral illnesses during the course of her pregnancy. Her medical and surgical histories were contributory for diabetes, for which she started glyburide today. She is scheduled for diabetes education on 04/07/12. It is important to monitor and control blood glucose levels during a pregnancy complicated with maternal diabetes, as this condition has been associated with an increased risk for both fetal and maternal complications.  Additionally, there appears to be an increased risk of congenital birth defects that is 2-5 times higher than the risk of the general population.  However, the risk to the baby greatly depends on the diabetic control of the mother.  I counseled Ms. Sara Gibbs regarding the above risks and available options.  The approximate face-to-face time with the genetic counselor was 35 minutes.  Quinn Plowman, MS,  Certified Genetic Counselor  04/07/2012

## 2012-04-07 NOTE — Patient Instructions (Addendum)
Following an appropriate diet and keeping your blood sugar under control is the most important thing to do for your health and that of your unborn baby.  Please check your blood sugar 4 times daily.  Please keep accurate BS logs and bring them with you to every visit.  Please bring your meter also.  Goals for Blood sugar should be: 1. Fasting (first thing in the morning before eating) should be less than 90.   2.  2 hours after meals should be less than 120.  Please eat 3 meals and 3 snacks.  Include protein (meat, dairy-cheese, eggs, nuts) with all meals.  Be mindful that carbohydrates increase your blood sugar.  Not just sweet food (cookies, cake, donuts, fruit, juice, soda) but also bread, pasta, rice, and potatoes.  You have to limit how many carbs you are eating.  Adding exercise, as little as 30 minutes a day can decrease your blood sugar.  Gestational Diabetes Mellitus Gestational diabetes mellitus (GDM) is diabetes that occurs only during pregnancy. This happens when the body cannot properly handle the glucose (sugar) that increases in the blood after eating. During pregnancy, insulin resistance (reduced sensitivity to insulin) occurs because of the release of hormones from the placenta. Usually, the pancreas of pregnant women produces enough insulin to overcome the resistance that occurs. However, in gestational diabetes, the insulin is there but it does not work effectively. If the resistance is severe enough that the pancreas does not produce enough insulin, extra glucose builds up in the blood.  WHO IS AT RISK FOR DEVELOPING GESTATIONAL DIABETES?  Women with a history of diabetes in the family.  Women over age 92.  Women who are overweight.  Women in certain ethnic groups (Hispanic, African American, Native American, [place] and [place] Islander). WHAT CAN HAPPEN TO THE BABY? If the mother's blood glucose is too high while she is pregnant, the extra sugar will travel through the  umbilical cord to the baby. Some of the problems the baby may have are:  Large Baby - If the baby receives too much sugar, the baby will gain more weight. This may cause the baby to be too large to be born normally (vaginally) and a Cesarean section (C-section) may be needed.  Low Blood Glucose (hypoglycemia)  The baby makes extra insulin, in response to the extra sugar its gets from its mother. When the baby is born and no longer needs this extra insulin, the baby's blood glucose level may drop.  Jaundice (yellow coloring of the skin and eyes)  This is fairly common in babies. It is caused from a build-up of the chemical called bilirubin. This is rarely serious, but is seen more often in babies whose mothers had gestational diabetes. RISKS TO THE MOTHER Women who have had gestational diabetes may be at higher risk for some problems, including:  Preeclampsia or toxemia, which includes problems with high blood pressure. Blood pressure and protein levels in the urine must be checked frequently.  Infections.  Cesarean section (C-section) for delivery.  Developing Type 2 diabetes later in life. About 30-50% will develop diabetes later, especially if obese. DIAGNOSIS  The hormones that cause insulin resistance are highest at about 24-28 weeks of pregnancy. If symptoms are experienced, they are much like symptoms you would normally expect during pregnancy.  GDM is often diagnosed using a two part method: 1. After 24-28 weeks of pregnancy, the woman drinks a glucose solution and takes a blood test. If the glucose level is high,  a second test will be given. 2. Oral Glucose Tolerance Test (OGTT) which is 3 hours long  After not eating overnight, the blood glucose is checked. The woman drinks a glucose solution, and hourly blood glucose tests are taken. If the woman has risk factors for GDM, the caregiver may test earlier than 24 weeks of pregnancy. TREATMENT  Treatment of GDM is directed at keeping  the mother's blood glucose level normal, and may include:  Meal planning.  Taking insulin or other medicine to control your blood glucose level.  Exercise.  Keeping a daily record of the foods you eat.  Blood glucose monitoring and keeping a record of your blood glucose levels.  May monitor ketone levels in the urine, although this is no longer considered necessary in most pregnancies. HOME CARE INSTRUCTIONS  While you are pregnant:  Follow your caregiver's advice regarding your prenatal appointments, meal planning, exercise, medicines, vitamins, blood and other tests, and physical activities.  Keep a record of your meals, blood glucose tests, and the amount of insulin you are taking (if any). Show this to your caregiver at every prenatal visit.  If you have GDM, you may have problems with hypoglycemia (low blood glucose). You may suspect this if you become suddenly dizzy, feel shaky, and/or weak. If you think this is happening and you have a glucose meter, try to test your blood glucose level. Follow your caregiver's advice for when and how to treat your low blood glucose. Generally, the 15:15 rule is followed: Treat by consuming 15 grams of carbohydrates, wait 15 minutes, and recheck blood glucose. Examples of 15 grams of carbohydrates are:  1 cup skim or low-fat milk.   cup juice.  3-4 glucose tablets.  5-6 hard candies.  1 small box raisins.   cup regular soda pop.  Practice good hygiene, to avoid infections.  Do not smoke. SEEK MEDICAL CARE IF:   You develop abnormal vaginal discharge, with or without itching.  You become weak and tired more than expected.  You seem to sweat a lot.  You have a sudden increase in weight, 5 pounds or more in one week.  You are losing weight, 3 pounds or more in a week.  Your blood glucose level is high, and you need instructions on what to do about it. SEEK IMMEDIATE MEDICAL CARE IF:   You develop a severe headache.  You  faint or pass out.  You develop nausea and vomiting.  You become disoriented or confused.  You have a convulsion.  You develop vision problems.  You develop stomach pain.  You develop vaginal bleeding.  You develop uterine contractions.  You have leaking or a gush of fluid from the vagina. AFTER YOU HAVE THE BABY:  Go to all of your follow-up appointments, and have blood tests as advised by your caregiver.  Maintain a healthy lifestyle, to prevent diabetes in the future. This includes:  Following a healthy meal plan.  Controlling your weight.  Getting enough exercise and proper rest.  Do not smoke.  Breastfeed your baby if you can. This will lower the chance of you and your baby developing diabetes later in life. For more information about diabetes, go to the American Diabetes Association at: PMFashions.com.cy. For more information about gestational diabetes, go to the [employer] at: RentRule.com.au. Document Released: 07/23/2000 Document Revised: 07/09/2011 Document Reviewed: 02/14/2009 [facility] Patient Information 2013 [place], [place].  Breastfeeding Deciding to breastfeed is one of the best choices you can  make for you and your baby. The information that follows gives a brief overview of the benefits of breastfeeding as well as common topics surrounding breastfeeding. BENEFITS OF BREASTFEEDING For the baby  The first milk (colostrum) helps the baby's digestive system function better.   There are antibodies in the mother's milk that help the baby fight off infections.   The baby has a lower incidence of asthma, allergies, and sudden infant death syndrome (SIDS).   The nutrients in breast milk are better for the baby than infant formulas, and breast milk helps the baby's brain grow better.   Babies who breastfeed have less gas, colic, and constipation.  For the mother  Breastfeeding helps develop a  very special bond between the mother and her baby.   Breastfeeding is convenient, always available at the correct temperature, and costs nothing.   Breastfeeding burns calories in the mother and helps her lose weight that was gained during pregnancy.   Breastfeeding makes the uterus contract back down to normal size faster and slows bleeding following delivery.   Breastfeeding mothers have a lower risk of developing breast cancer.  BREASTFEEDING FREQUENCY  A healthy, full-term baby may breastfeed as often as every hour or space his or her feedings to every 3 hours.   Watch your baby for signs of hunger. Nurse your baby if he or she shows signs of hunger. How often you nurse will vary from baby to baby.   Nurse as often as the baby requests, or when you feel the need to reduce the fullness of your breasts.   Awaken the baby if it has been 3 4 hours since the last feeding.   Frequent feeding will help the mother make more milk and will help prevent problems, such as sore nipples and engorgement of the breasts.  BABY'S POSITION AT THE BREAST  Whether lying down or sitting, be sure that the baby's tummy is facing your tummy.   Support the breast with 4 fingers underneath the breast and the thumb above. Make sure your fingers are well away from the nipple and baby's mouth.   Stroke the baby's lips gently with your finger or nipple.   When the baby's mouth is open wide enough, place all of your nipple and as much of the areola as possible into your baby's mouth.   Pull the baby in close so the tip of the nose and the baby's cheeks touch the breast during the feeding.  FEEDINGS AND SUCTION  The length of each feeding varies from baby to baby and from feeding to feeding.   The baby must suck about 2 3 minutes for your milk to get to him or her. This is called a "let down." For this reason, allow the baby to feed on each breast as long as he or she wants. Your baby will end  the feeding when he or she has received the right balance of nutrients.   To break the suction, put your finger into the corner of the baby's mouth and slide it between his or her gums before removing your breast from his or her mouth. This will help prevent sore nipples.  HOW TO TELL WHETHER YOUR BABY IS GETTING ENOUGH BREAST MILK. Wondering whether or not your baby is getting enough milk is a common concern among mothers. You can be assured that your baby is getting enough milk if:   Your baby is actively sucking and you hear swallowing.   Your baby seems relaxed  and satisfied after a feeding.   Your baby nurses at least 8 12 times in a 24 hour time period. Nurse your baby until he or she unlatches or falls asleep at the first breast (at least 10 20 minutes), then offer the second side.   Your baby is wetting 5 6 disposable diapers (6 8 cloth diapers) in a 24 hour period by 41 74 days of age.   Your baby is having at least 3 4 stools every 24 hours for the first 6 weeks. The stool should be soft and yellow.   Your baby should gain 4 7 ounces per week after he or she is 39 days old.   Your breasts feel softer after nursing.  REDUCING BREAST ENGORGEMENT  In the first week after your baby is born, you may experience signs of breast engorgement. When breasts are engorged, they feel heavy, warm, full, and may be tender to the touch. You can reduce engorgement if you:   Nurse frequently, every 2 3 hours. Mothers who breastfeed early and often have fewer problems with engorgement.   Place light ice packs on your breasts for 10 20 minutes between feedings. This reduces swelling. Wrap the ice packs in a lightweight towel to protect your skin. Bags of frozen vegetables work well for this purpose.   Take a warm shower or apply warm, moist heat to your breast for 5 10 minutes just before each feeding. This increases circulation and helps the milk flow.   Gently massage your breast before  and during the feeding. Using your finger tips, massage from the chest wall towards your nipple in a circular motion.   Make sure that the baby empties at least one breast at every feeding before switching sides.   Use a breast pump to empty the breasts if your baby is sleepy or not nursing well. You may also want to pump if you are returning to work oryou feel you are getting engorged.   Avoid bottle feeds, pacifiers, or supplemental feedings of water or juice in place of breastfeeding. Breast milk is all the food your baby needs. It is not necessary for your baby to have water or formula. In fact, to help your breasts make more milk, it is best not to give your baby supplemental feedings during the early weeks.   Be sure the baby is latched on and positioned properly while breastfeeding.   Wear a supportive bra, avoiding underwire styles.   Eat a balanced diet with enough fluids.   Rest often, relax, and take your prenatal vitamins to prevent fatigue, stress, and anemia.  If you follow these suggestions, your engorgement should improve in 24 48 hours. If you are still experiencing difficulty, call your lactation consultant or caregiver.  CARING FOR YOURSELF Take care of your breasts  Bathe or shower daily.   Avoid using soap on your nipples.   Start feedings on your left breast at one feeding and on your right breast at the next feeding.   You will notice an increase in your milk supply 2 5 days after delivery. You may feel some discomfort from engorgement, which makes your breasts very firm and often tender. Engorgement "peaks" out within 24 48 hours. In the meantime, apply warm moist towels to your breasts for 5 10 minutes before feeding. Gentle massage and expression of some milk before feeding will soften your breasts, making it easier for your baby to latch on.   Wear a well-fitting nursing bra, and  air dry your nipples for a 3 after each feeding.   Only  use cotton bra pads.   Only use pure lanolin on your nipples after nursing. You do not need to wash it off before feeding the baby again. Another option is to express a few drops of breast milk and gently massage it into your nipples.  Take care of yourself  Eat well-balanced meals and nutritious snacks.   Drinking milk, fruit juice, and water to satisfy your thirst (about 8 glasses a day).   Get plenty of rest.  Avoid foods that you notice affect the baby in a bad way.  SEEK MEDICAL CARE IF:   You have difficulty with breastfeeding and need help.   You have a hard, red, sore area on your breast that is accompanied by a fever.   Your baby is too sleepy to eat well or is having trouble sleeping.   Your baby is wetting less than 6 diapers a day, by 22 days of age.   Your baby's skin or white part of his or her eyes is more yellow than it was in the hospital.   You feel depressed.  Document Released: 04/16/2005 Document Revised: 10/16/2011 Document Reviewed: 07/15/2011 [facility] Patient Information 2013 [place], [place].

## 2012-04-07 NOTE — Progress Notes (Signed)
Pulse-122  Edema-hands and feet

## 2012-04-11 ENCOUNTER — Ambulatory Visit (INDEPENDENT_AMBULATORY_CARE_PROVIDER_SITE_OTHER): Payer: Medicaid Other | Admitting: *Deleted

## 2012-04-11 VITALS — BP 127/65 | Wt 225.2 lb

## 2012-04-11 DIAGNOSIS — O99810 Abnormal glucose complicating pregnancy: Secondary | ICD-10-CM

## 2012-04-11 NOTE — Progress Notes (Signed)
P = 107 

## 2012-04-12 LAB — OB RESULTS CONSOLE GC/CHLAMYDIA: Chlamydia: NEGATIVE

## 2012-04-14 ENCOUNTER — Ambulatory Visit (INDEPENDENT_AMBULATORY_CARE_PROVIDER_SITE_OTHER): Payer: Medicaid Other | Admitting: Family Medicine

## 2012-04-14 VITALS — BP 117/82 | Temp 97.4°F | Wt 224.8 lb

## 2012-04-14 DIAGNOSIS — O99810 Abnormal glucose complicating pregnancy: Secondary | ICD-10-CM

## 2012-04-14 LAB — POCT URINALYSIS DIP (DEVICE)
Bilirubin Urine: NEGATIVE
Glucose, UA: NEGATIVE mg/dL
Ketones, ur: NEGATIVE mg/dL
Specific Gravity, Urine: 1.015 (ref 1.005–1.030)

## 2012-04-14 NOTE — Progress Notes (Signed)
NST reviewed and reactive. FBS 63-110 (most in range--except one not true fasting.) 2 hour pp 87-180, most in range.

## 2012-04-14 NOTE — Progress Notes (Signed)
Pulse: 107 

## 2012-04-15 ENCOUNTER — Telehealth (HOSPITAL_COMMUNITY): Payer: Self-pay | Admitting: MS"

## 2012-04-15 NOTE — Telephone Encounter (Signed)
Left message for patient to return call.   Sara Gibbs 04/15/2012 3:10 PM

## 2012-04-16 ENCOUNTER — Telehealth (HOSPITAL_COMMUNITY): Payer: Self-pay | Admitting: MS"

## 2012-04-16 NOTE — Telephone Encounter (Signed)
Called Sara Gibbs to discuss the results from her amniocentesis, which was performed for molecular analysis for sickle cell.  We reviewed that these indicate the presence of one copy of hemoglobin S, which is consistent with sickle cell trait. We reviewed that the baby is predicted to have sickle cell trait given the report that the father of the pregnancy has hemoglobin AS and not another hemoglobin variant, such as beta thalassemia.  Sara Gibbs was happy to hear these results.  All questions were answered to her satisfaction, she was encouraged to call with additional questions or concerns. Per the patient's previous request, chromosome analysis was not performed on the amniocentesis sample.   Quinn Plowman, MS Certified Genetic Counselor 04/16/2012 3:39 PM

## 2012-04-18 ENCOUNTER — Ambulatory Visit (INDEPENDENT_AMBULATORY_CARE_PROVIDER_SITE_OTHER): Payer: Medicaid Other | Admitting: *Deleted

## 2012-04-18 VITALS — BP 118/73 | Wt 224.2 lb

## 2012-04-18 DIAGNOSIS — O99810 Abnormal glucose complicating pregnancy: Secondary | ICD-10-CM

## 2012-04-18 NOTE — Progress Notes (Signed)
P-96 

## 2012-04-18 NOTE — Progress Notes (Signed)
12/13 NST reviewed and reactive

## 2012-04-21 ENCOUNTER — Ambulatory Visit (INDEPENDENT_AMBULATORY_CARE_PROVIDER_SITE_OTHER): Payer: Medicaid Other | Admitting: Family Medicine

## 2012-04-21 VITALS — BP 112/65 | Wt 225.4 lb

## 2012-04-21 DIAGNOSIS — O99810 Abnormal glucose complicating pregnancy: Secondary | ICD-10-CM

## 2012-04-21 DIAGNOSIS — R319 Hematuria, unspecified: Secondary | ICD-10-CM

## 2012-04-21 DIAGNOSIS — B356 Tinea cruris: Secondary | ICD-10-CM

## 2012-04-21 DIAGNOSIS — D573 Sickle-cell trait: Secondary | ICD-10-CM

## 2012-04-21 LAB — POCT URINALYSIS DIP (DEVICE)
Bilirubin Urine: NEGATIVE
Glucose, UA: NEGATIVE mg/dL
Ketones, ur: 15 mg/dL — AB
Nitrite: NEGATIVE
Specific Gravity, Urine: 1.015 (ref 1.005–1.030)
pH: 7 (ref 5.0–8.0)

## 2012-04-21 MED ORDER — GLYBURIDE 2.5 MG PO TABS: 2.5000 mg | ORAL_TABLET | Freq: Two times a day (BID) | ORAL | Status: DC

## 2012-04-21 MED ORDER — NYSTATIN 100000 UNIT/GM EX CREA: TOPICAL_CREAM | Freq: Two times a day (BID) | CUTANEOUS | Status: DC

## 2012-04-21 NOTE — Progress Notes (Signed)
NST reviewed and reactive. FBS 60-110 2 hr pp 62-173 Most highs are after dinner.  Will increase glyburide to 5 mg q am

## 2012-04-21 NOTE — Progress Notes (Signed)
P = 108      Pt reports some bleeding w/urination- unsure if from vagina- denies pain or burning while voiding.  Pt also c/o dryness and itching of nipples.  Growth [place] scheduled 05/01/12.

## 2012-04-21 NOTE — Patient Instructions (Signed)
Gestational Diabetes Mellitus Gestational diabetes mellitus (GDM) is diabetes that occurs only during pregnancy. This happens when the body cannot properly handle the glucose (sugar) that increases in the blood after eating. During pregnancy, insulin resistance (reduced sensitivity to insulin) occurs because of the release of hormones from the placenta. Usually, the pancreas of pregnant women produces enough insulin to overcome the resistance that occurs. However, in gestational diabetes, the insulin is there but it does not work effectively. If the resistance is severe enough that the pancreas does not produce enough insulin, extra glucose builds up in the blood.  WHO IS AT RISK FOR DEVELOPING GESTATIONAL DIABETES?  Women with a history of diabetes in the family.  Women over age 25.  Women who are overweight.  Women in certain ethnic groups (Hispanic, African American, Native American, Asian and Pacific Islander). WHAT CAN HAPPEN TO THE BABY? If the mother's blood glucose is too high while she is pregnant, the extra sugar will travel through the umbilical cord to the baby. Some of the problems the baby may have are:  Large Baby - If the baby receives too much sugar, the baby will gain more weight. This may cause the baby to be too large to be born normally (vaginally) and a Cesarean section (C-section) may be needed.  Low Blood Glucose (hypoglycemia)  The baby makes extra insulin, in response to the extra sugar its gets from its mother. When the baby is born and no longer needs this extra insulin, the baby's blood glucose level may drop.  Jaundice (yellow coloring of the skin and eyes)  This is fairly common in babies. It is caused from a build-up of the chemical called bilirubin. This is rarely serious, but is seen more often in babies whose mothers had gestational diabetes. RISKS TO THE MOTHER Women who have had gestational diabetes may be at higher risk for some problems,  including:  Preeclampsia or toxemia, which includes problems with high blood pressure. Blood pressure and protein levels in the urine must be checked frequently.  Infections.  Cesarean section (C-section) for delivery.  Developing Type 2 diabetes later in life. About 30-50% will develop diabetes later, especially if obese. DIAGNOSIS  The hormones that cause insulin resistance are highest at about 24-28 weeks of pregnancy. If symptoms are experienced, they are much like symptoms you would normally expect during pregnancy.  GDM is often diagnosed using a two part method: 1. After 24-28 weeks of pregnancy, the woman drinks a glucose solution and takes a blood test. If the glucose level is high, a second test will be given. 2. Oral Glucose Tolerance Test (OGTT) which is 3 hours long  After not eating overnight, the blood glucose is checked. The woman drinks a glucose solution, and hourly blood glucose tests are taken. If the woman has risk factors for GDM, the caregiver may test earlier than 24 weeks of pregnancy. TREATMENT  Treatment of GDM is directed at keeping the mother's blood glucose level normal, and may include:  Meal planning.  Taking insulin or other medicine to control your blood glucose level.  Exercise.  Keeping a daily record of the foods you eat.  Blood glucose monitoring and keeping a record of your blood glucose levels.  May monitor ketone levels in the urine, although this is no longer considered necessary in most pregnancies. HOME CARE INSTRUCTIONS  While you are pregnant:  Follow your caregiver's advice regarding your prenatal appointments, meal planning, exercise, medicines, vitamins, blood and other tests, and physical   activities.  Keep a record of your meals, blood glucose tests, and the amount of insulin you are taking (if any). Show this to your caregiver at every prenatal visit.  If you have GDM, you may have problems with hypoglycemia (low blood glucose).  You may suspect this if you become suddenly dizzy, feel shaky, and/or weak. If you think this is happening and you have a glucose meter, try to test your blood glucose level. Follow your caregiver's advice for when and how to treat your low blood glucose. Generally, the 15:15 rule is followed: Treat by consuming 15 grams of carbohydrates, wait 15 minutes, and recheck blood glucose. Examples of 15 grams of carbohydrates are:  1 cup skim or low-fat milk.   cup juice.  3-4 glucose tablets.  5-6 hard candies.  1 small box raisins.   cup regular soda pop.  Practice good hygiene, to avoid infections.  Do not smoke. SEEK MEDICAL CARE IF:   You develop abnormal vaginal discharge, with or without itching.  You become weak and tired more than expected.  You seem to sweat a lot.  You have a sudden increase in weight, 5 pounds or more in one week.  You are losing weight, 3 pounds or more in a week.  Your blood glucose level is high, and you need instructions on what to do about it. SEEK IMMEDIATE MEDICAL CARE IF:   You develop a severe headache.  You faint or pass out.  You develop nausea and vomiting.  You become disoriented or confused.  You have a convulsion.  You develop vision problems.  You develop stomach pain.  You develop vaginal bleeding.  You develop uterine contractions.  You have leaking or a gush of fluid from the vagina. AFTER YOU HAVE THE BABY:  Go to all of your follow-up appointments, and have blood tests as advised by your caregiver.  Maintain a healthy lifestyle, to prevent diabetes in the future. This includes:  Following a healthy meal plan.  Controlling your weight.  Getting enough exercise and proper rest.  Do not smoke.  Breastfeed your baby if you can. This will lower the chance of you and your baby developing diabetes later in life. For more information about diabetes, go to the American Diabetes Association at:  www.americandiabetesassociation.org. For more information about gestational diabetes, go to the American Congress of Obstetricians and Gynecologists at: www.acog.org. Document Released: 07/23/2000 Document Revised: 07/09/2011 Document Reviewed: 02/14/2009 [employer] Patient Information 2013 [employer].  Breastfeeding Deciding to breastfeed is one of the best choices you can make for you and your baby. The information that follows gives a brief overview of the benefits of breastfeeding as well as common topics surrounding breastfeeding. BENEFITS OF BREASTFEEDING For the baby  The first milk (colostrum) helps the baby's digestive system function better.   There are antibodies in the mother's milk that help the baby fight off infections.   The baby has a lower incidence of asthma, allergies, and sudden infant death syndrome (SIDS).   The nutrients in breast milk are better for the baby than infant formulas, and breast milk helps the baby's brain grow better.   Babies who breastfeed have less gas, colic, and constipation.  For the mother  Breastfeeding helps develop a very special bond between the mother and her baby.   Breastfeeding is convenient, always available at the correct temperature, and costs nothing.   Breastfeeding burns calories in the mother and helps her lose weight that was gained during pregnancy.     Breastfeeding makes the uterus contract back down to normal size faster and slows bleeding following delivery.   Breastfeeding mothers have a lower risk of developing breast cancer.  BREASTFEEDING FREQUENCY  A healthy, full-term baby may breastfeed as often as every hour or space his or her feedings to every 3 hours.   Watch your baby for signs of hunger. Nurse your baby if he or she shows signs of hunger. How often you nurse will vary from baby to baby.   Nurse as often as the baby requests, or when you feel the need to reduce the fullness of your breasts.    Awaken the baby if it has been 3 4 hours since the last feeding.   Frequent feeding will help the mother make more milk and will help prevent problems, such as sore nipples and engorgement of the breasts.  BABY'S POSITION AT THE BREAST  Whether lying down or sitting, be sure that the baby's tummy is facing your tummy.   Support the breast with 4 fingers underneath the breast and the thumb above. Make sure your fingers are well away from the nipple and baby's mouth.   Stroke the baby's lips gently with your finger or nipple.   When the baby's mouth is open wide enough, place all of your nipple and as much of the areola as possible into your baby's mouth.   Pull the baby in close so the tip of the nose and the baby's cheeks touch the breast during the feeding.  FEEDINGS AND SUCTION  The length of each feeding varies from baby to baby and from feeding to feeding.   The baby must suck about 2 3 minutes for your milk to get to him or her. This is called a "let down." For this reason, allow the baby to feed on each breast as long as he or she wants. Your baby will end the feeding when he or she has received the right balance of nutrients.   To break the suction, put your finger into the corner of the baby's mouth and slide it between his or her gums before removing your breast from his or her mouth. This will help prevent sore nipples.  HOW TO TELL WHETHER YOUR BABY IS GETTING ENOUGH BREAST MILK. Wondering whether or not your baby is getting enough milk is a common concern among mothers. You can be assured that your baby is getting enough milk if:   Your baby is actively sucking and you hear swallowing.   Your baby seems relaxed and satisfied after a feeding.   Your baby nurses at least 8 12 times in a 24 hour time period. Nurse your baby until he or she unlatches or falls asleep at the first breast (at least 10 20 minutes), then offer the second side.   Your baby is wetting  5 6 disposable diapers (6 8 cloth diapers) in a 24 hour period by 5 6 days of age.   Your baby is having at least 3 4 stools every 24 hours for the first 6 weeks. The stool should be soft and yellow.   Your baby should gain 4 7 ounces per week after he or she is 4 days old.   Your breasts feel softer after nursing.  REDUCING BREAST ENGORGEMENT  In the first week after your baby is born, you may experience signs of breast engorgement. When breasts are engorged, they feel heavy, warm, full, and may be tender to the touch. You can reduce   engorgement if you:   Nurse frequently, every 2 3 hours. Mothers who breastfeed early and often have fewer problems with engorgement.   Place light ice packs on your breasts for 10 20 minutes between feedings. This reduces swelling. Wrap the ice packs in a lightweight towel to protect your skin. Bags of frozen vegetables work well for this purpose.   Take a warm shower or apply warm, moist heat to your breast for 5 10 minutes just before each feeding. This increases circulation and helps the milk flow.   Gently massage your breast before and during the feeding. Using your finger tips, massage from the chest wall towards your nipple in a circular motion.   Make sure that the baby empties at least one breast at every feeding before switching sides.   Use a breast pump to empty the breasts if your baby is sleepy or not nursing well. You may also want to pump if you are returning to work oryou feel you are getting engorged.   Avoid bottle feeds, pacifiers, or supplemental feedings of water or juice in place of breastfeeding. Breast milk is all the food your baby needs. It is not necessary for your baby to have water or formula. In fact, to help your breasts make more milk, it is best not to give your baby supplemental feedings during the early weeks.   Be sure the baby is latched on and positioned properly while breastfeeding.   Wear a supportive  bra, avoiding underwire styles.   Eat a balanced diet with enough fluids.   Rest often, relax, and take your prenatal vitamins to prevent fatigue, stress, and anemia.  If you follow these suggestions, your engorgement should improve in 24 48 hours. If you are still experiencing difficulty, call your lactation consultant or caregiver.  CARING FOR YOURSELF Take care of your breasts  Bathe or shower daily.   Avoid using soap on your nipples.   Start feedings on your left breast at one feeding and on your right breast at the next feeding.   You will notice an increase in your milk supply 2 5 days after delivery. You may feel some discomfort from engorgement, which makes your breasts very firm and often tender. Engorgement "peaks" out within 24 48 hours. In the meantime, apply warm moist towels to your breasts for 5 10 minutes before feeding. Gentle massage and expression of some milk before feeding will soften your breasts, making it easier for your baby to latch on.   Wear a well-fitting nursing bra, and air dry your nipples for a 3 4minutes after each feeding.   Only use cotton bra pads.   Only use pure lanolin on your nipples after nursing. You do not need to wash it off before feeding the baby again. Another option is to express a few drops of breast milk and gently massage it into your nipples.  Take care of yourself  Eat well-balanced meals and nutritious snacks.   Drinking milk, fruit juice, and water to satisfy your thirst (about 8 glasses a day).   Get plenty of rest.  Avoid foods that you notice affect the baby in a bad way.  SEEK MEDICAL CARE IF:   You have difficulty with breastfeeding and need help.   You have a hard, red, sore area on your breast that is accompanied by a fever.   Your baby is too sleepy to eat well or is having trouble sleeping.   Your baby is wetting less than   6 diapers a day, by 5 days of age.   Your baby's skin or white part of  his or her eyes is more yellow than it was in the hospital.   You feel depressed.  Document Released: 04/16/2005 Document Revised: 10/16/2011 Document Reviewed: 07/15/2011 [employer] Patient Information 2013 [employer].  

## 2012-04-21 NOTE — Progress Notes (Signed)
Nutrition note: f/u Pt has gained 45.4# @ [redacted]w[redacted]d, which is > expected but pt has only gained 5.5# in past 6 wks so ~0.91#/wk, which is close to wnl. BS: Fasting- 60-110; 2hr pp- 62-173 Pt reports following GDM diet & has healthy snacks with her. Pt reports taking 2 chewable multivitamin/d.  Pt reports no N&V or heartburn because she's taking her meds. Reviewed GDM diet & importance of planning when things are busy. Disc wt gain goals of 0.5#/wk. Pt still states she does not plan to BF. F/u if referred Blondell Reveal, MS, RD, LDN

## 2012-04-22 LAB — CULTURE, OB URINE

## 2012-04-25 ENCOUNTER — Ambulatory Visit (INDEPENDENT_AMBULATORY_CARE_PROVIDER_SITE_OTHER): Payer: Medicaid Other | Admitting: *Deleted

## 2012-04-25 VITALS — BP 120/72 | Wt 224.9 lb

## 2012-04-25 DIAGNOSIS — O99810 Abnormal glucose complicating pregnancy: Secondary | ICD-10-CM

## 2012-04-25 NOTE — Progress Notes (Signed)
P = 104   Cx exam per Dr. Debroah Loop due to UC's-  Long, closed, posterior.  Labor precautions given.

## 2012-04-28 ENCOUNTER — Encounter: Payer: Self-pay | Admitting: Family Medicine

## 2012-04-28 ENCOUNTER — Ambulatory Visit (INDEPENDENT_AMBULATORY_CARE_PROVIDER_SITE_OTHER): Payer: Medicaid Other | Admitting: Family Medicine

## 2012-04-28 VITALS — BP 119/72 | Wt 222.2 lb

## 2012-04-28 DIAGNOSIS — O24919 Unspecified diabetes mellitus in pregnancy, unspecified trimester: Secondary | ICD-10-CM

## 2012-04-28 DIAGNOSIS — O99810 Abnormal glucose complicating pregnancy: Secondary | ICD-10-CM

## 2012-04-28 LAB — POCT URINALYSIS DIP (DEVICE)
Protein, ur: NEGATIVE mg/dL
Specific Gravity, Urine: 1.015 (ref 1.005–1.030)
Urobilinogen, UA: 0.2 mg/dL (ref 0.0–1.0)
pH: 7 (ref 5.0–8.0)

## 2012-04-28 NOTE — Progress Notes (Signed)
NST 12-20 reactive

## 2012-04-28 NOTE — Progress Notes (Signed)
P-[id number] 

## 2012-04-28 NOTE — Progress Notes (Signed)
FBS 59-90 2hr pp 79-160 8 of 19 out of range Mom died this am secondary to ovarian cancer--6 year battle Discussed appropriate timing of IOL at 60 wks--Her daughter has a birthday party planned for 2/1.

## 2012-04-28 NOTE — Progress Notes (Signed)
NST reviewed and reactive.  

## 2012-04-28 NOTE — Patient Instructions (Signed)
Contraception Choices Contraception (birth control) is the use of any methods or devices to prevent pregnancy. Below are some methods to help avoid pregnancy. HORMONAL METHODS   Contraceptive implant. This is a thin, plastic tube containing progesterone hormone. It does not contain estrogen hormone. Your caregiver inserts the tube in the inner part of the upper arm. The tube can remain in place for up to 3 years. After 3 years, the implant must be removed. The implant prevents the ovaries from releasing an egg (ovulation), thickens the cervical mucus which prevents sperm from entering the uterus, and thins the lining of the inside of the uterus.  Progesterone-only injections. These injections are given every 3 months by your caregiver to prevent pregnancy. This synthetic progesterone hormone stops the ovaries from releasing eggs. It also thickens cervical mucus and changes the uterine lining. This makes it harder for sperm to survive in the uterus.  Birth control pills. These pills contain estrogen and progesterone hormone. They work by stopping the egg from forming in the ovary (ovulation). Birth control pills are prescribed by a caregiver.Birth control pills can also be used to treat heavy periods.  Minipill. This type of birth control pill contains only the progesterone hormone. They are taken every day of each month and must be prescribed by your caregiver.  Birth control patch. The patch contains hormones similar to those in birth control pills. It must be changed once a week and is prescribed by a caregiver.  Vaginal ring. The ring contains hormones similar to those in birth control pills. It is left in the vagina for 3 weeks, removed for 1 week, and then a new one is put back in place. The patient must be comfortable inserting and removing the ring from the vagina.A caregiver's prescription is necessary.  Emergency contraception. Emergency contraceptives prevent pregnancy after unprotected  sexual intercourse. This pill can be taken right after sex or up to 5 days after unprotected sex. It is most effective the sooner you take the pills after having sexual intercourse. Emergency contraceptive pills are available without a prescription. Check with your pharmacist. Do not use emergency contraception as your only form of birth control. BARRIER METHODS   Female condom. This is a thin sheath (latex or rubber) that is worn over the penis during sexual intercourse. It can be used with spermicide to increase effectiveness.  Female condom. This is a soft, loose-fitting sheath that is put into the vagina before sexual intercourse.  Diaphragm. This is a soft, latex, dome-shaped barrier that must be fitted by a caregiver. It is inserted into the vagina, along with a spermicidal jelly. It is inserted before intercourse. The diaphragm should be left in the vagina for 6 to 8 hours after intercourse.  Cervical cap. This is a round, soft, latex or plastic cup that fits over the cervix and must be fitted by a caregiver. The cap can be left in place for up to 48 hours after intercourse.  Sponge. This is a soft, circular piece of polyurethane foam. The sponge has spermicide in it. It is inserted into the vagina after wetting it and before sexual intercourse.  Spermicides. These are chemicals that kill or block sperm from entering the cervix and uterus. They come in the form of creams, jellies, suppositories, foam, or tablets. They do not require a prescription. They are inserted into the vagina with an applicator before having sexual intercourse. The process must be repeated every time you have sexual intercourse. INTRAUTERINE CONTRACEPTION  Intrauterine device (  IUD). This is a T-shaped device that is put in a woman's uterus during a menstrual period to prevent pregnancy. There are 2 types:  Copper IUD. This type of IUD is wrapped in copper wire and is placed inside the uterus. Copper makes the uterus and  fallopian tubes produce a fluid that kills sperm. It can stay in place for 10 years.  Hormone IUD. This type of IUD contains the hormone progestin (synthetic progesterone). The hormone thickens the cervical mucus and prevents sperm from entering the uterus, and it also thins the uterine lining to prevent implantation of a fertilized egg. The hormone can weaken or kill the sperm that get into the uterus. It can stay in place for 5 years. PERMANENT METHODS OF CONTRACEPTION  Female tubal ligation. This is when the woman's fallopian tubes are surgically sealed, tied, or blocked to prevent the egg from traveling to the uterus.  Female sterilization. This is when the female has the tubes that carry sperm tied off (vasectomy).This blocks sperm from entering the vagina during sexual intercourse. After the procedure, the man can still ejaculate fluid (semen). NATURAL PLANNING METHODS  Natural family planning. This is not having sexual intercourse or using a barrier method (condom, diaphragm, cervical cap) on days the woman could become pregnant.  Calendar method. This is keeping track of the length of each menstrual cycle and identifying when you are fertile.  Ovulation method. This is avoiding sexual intercourse during ovulation.  Symptothermal method. This is avoiding sexual intercourse during ovulation, using a thermometer and ovulation symptoms.  Post-ovulation method. This is timing sexual intercourse after you have ovulated. Regardless of which type or method of contraception you choose, it is important that you use condoms to protect against the transmission of sexually transmitted diseases (STDs). Talk with your caregiver about which form of contraception is most appropriate for you. Document Released: 04/16/2005 Document Revised: 07/09/2011 Document Reviewed: 08/23/2010 [employer] Patient Information 2013 [employer]. Breastfeeding Deciding to breastfeed is one of the best choices you can make  for you and your baby. The information that follows gives a brief overview of the benefits of breastfeeding as well as common topics surrounding breastfeeding. BENEFITS OF BREASTFEEDING For the baby  The first milk (colostrum) helps the baby's digestive system function better.   There are antibodies in the mother's milk that help the baby fight off infections.   The baby has a lower incidence of asthma, allergies, and sudden infant death syndrome (SIDS).   The nutrients in breast milk are better for the baby than infant formulas, and breast milk helps the baby's brain grow better.   Babies who breastfeed have less gas, colic, and constipation.  For the mother  Breastfeeding helps develop a very special bond between the mother and her baby.   Breastfeeding is convenient, always available at the correct temperature, and costs nothing.   Breastfeeding burns calories in the mother and helps her lose weight that was gained during pregnancy.   Breastfeeding makes the uterus contract back down to normal size faster and slows bleeding following delivery.   Breastfeeding mothers have a lower risk of developing breast cancer.  BREASTFEEDING FREQUENCY  A healthy, full-term baby may breastfeed as often as every hour or space his or her feedings to every 3 hours.   Watch your baby for signs of hunger. Nurse your baby if he or she shows signs of hunger. How often you nurse will vary from baby to baby.   Nurse as often as   the baby requests, or when you feel the need to reduce the fullness of your breasts.   Awaken the baby if it has been 3 4 hours since the last feeding.   Frequent feeding will help the mother make more milk and will help prevent problems, such as sore nipples and engorgement of the breasts.  BABY'S POSITION AT THE BREAST  Whether lying down or sitting, be sure that the baby's tummy is facing your tummy.   Support the breast with 4 fingers underneath the  breast and the thumb above. Make sure your fingers are well away from the nipple and baby's mouth.   Stroke the baby's lips gently with your finger or nipple.   When the baby's mouth is open wide enough, place all of your nipple and as much of the areola as possible into your baby's mouth.   Pull the baby in close so the tip of the nose and the baby's cheeks touch the breast during the feeding.  FEEDINGS AND SUCTION  The length of each feeding varies from baby to baby and from feeding to feeding.   The baby must suck about 2 3 minutes for your milk to get to him or her. This is called a "let down." For this reason, allow the baby to feed on each breast as long as he or she wants. Your baby will end the feeding when he or she has received the right balance of nutrients.   To break the suction, put your finger into the corner of the baby's mouth and slide it between his or her gums before removing your breast from his or her mouth. This will help prevent sore nipples.  HOW TO TELL WHETHER YOUR BABY IS GETTING ENOUGH BREAST MILK. Wondering whether or not your baby is getting enough milk is a common concern among mothers. You can be assured that your baby is getting enough milk if:   Your baby is actively sucking and you hear swallowing.   Your baby seems relaxed and satisfied after a feeding.   Your baby nurses at least 8 12 times in a 24 hour time period. Nurse your baby until he or she unlatches or falls asleep at the first breast (at least 10 20 minutes), then offer the second side.   Your baby is wetting 5 6 disposable diapers (6 8 cloth diapers) in a 24 hour period by 5 6 days of age.   Your baby is having at least 3 4 stools every 24 hours for the first 6 weeks. The stool should be soft and yellow.   Your baby should gain 4 7 ounces per week after he or she is 4 days old.   Your breasts feel softer after nursing.  REDUCING BREAST ENGORGEMENT  In the first week after  your baby is born, you may experience signs of breast engorgement. When breasts are engorged, they feel heavy, warm, full, and may be tender to the touch. You can reduce engorgement if you:   Nurse frequently, every 2 3 hours. Mothers who breastfeed early and often have fewer problems with engorgement.   Place light ice packs on your breasts for 10 20 minutes between feedings. This reduces swelling. Wrap the ice packs in a lightweight towel to protect your skin. Bags of frozen vegetables work well for this purpose.   Take a warm shower or apply warm, moist heat to your breast for 5 10 minutes just before each feeding. This increases circulation and helps the   milk flow.   Gently massage your breast before and during the feeding. Using your finger tips, massage from the chest wall towards your nipple in a circular motion.   Make sure that the baby empties at least one breast at every feeding before switching sides.   Use a breast pump to empty the breasts if your baby is sleepy or not nursing well. You may also want to pump if you are returning to work oryou feel you are getting engorged.   Avoid bottle feeds, pacifiers, or supplemental feedings of water or juice in place of breastfeeding. Breast milk is all the food your baby needs. It is not necessary for your baby to have water or formula. In fact, to help your breasts make more milk, it is best not to give your baby supplemental feedings during the early weeks.   Be sure the baby is latched on and positioned properly while breastfeeding.   Wear a supportive bra, avoiding underwire styles.   Eat a balanced diet with enough fluids.   Rest often, relax, and take your prenatal vitamins to prevent fatigue, stress, and anemia.  If you follow these suggestions, your engorgement should improve in 24 48 hours. If you are still experiencing difficulty, call your lactation consultant or caregiver.  CARING FOR YOURSELF Take care of your  breasts  Bathe or shower daily.   Avoid using soap on your nipples.   Start feedings on your left breast at one feeding and on your right breast at the next feeding.   You will notice an increase in your milk supply 2 5 days after delivery. You may feel some discomfort from engorgement, which makes your breasts very firm and often tender. Engorgement "peaks" out within 24 48 hours. In the meantime, apply warm moist towels to your breasts for 5 10 minutes before feeding. Gentle massage and expression of some milk before feeding will soften your breasts, making it easier for your baby to latch on.   Wear a well-fitting nursing bra, and air dry your nipples for a 3 4minutes after each feeding.   Only use cotton bra pads.   Only use pure lanolin on your nipples after nursing. You do not need to wash it off before feeding the baby again. Another option is to express a few drops of breast milk and gently massage it into your nipples.  Take care of yourself  Eat well-balanced meals and nutritious snacks.   Drinking milk, fruit juice, and water to satisfy your thirst (about 8 glasses a day).   Get plenty of rest.  Avoid foods that you notice affect the baby in a bad way.  SEEK MEDICAL CARE IF:   You have difficulty with breastfeeding and need help.   You have a hard, red, sore area on your breast that is accompanied by a fever.   Your baby is too sleepy to eat well or is having trouble sleeping.   Your baby is wetting less than 6 diapers a day, by 5 days of age.   Your baby's skin or white part of his or her eyes is more yellow than it was in the hospital.   You feel depressed.  Document Released: 04/16/2005 Document Revised: 10/16/2011 Document Reviewed: 07/15/2011 [employer] Patient Information 2013 [employer].  

## 2012-05-01 ENCOUNTER — Ambulatory Visit (HOSPITAL_COMMUNITY)
Admission: RE | Admit: 2012-05-01 | Discharge: 2012-05-01 | Disposition: A | Discharge status: Home / Self Care | Payer: Medicaid Other | Source: Ambulatory Visit | Attending: Family Medicine | Admitting: Family Medicine

## 2012-05-01 ENCOUNTER — Encounter: Payer: Self-pay | Admitting: *Deleted

## 2012-05-01 VITALS — BP 121/65 | HR 110 | Wt 225.0 lb

## 2012-05-01 DIAGNOSIS — O0930 Supervision of pregnancy with insufficient antenatal care, unspecified trimester: Secondary | ICD-10-CM

## 2012-05-01 DIAGNOSIS — O99810 Abnormal glucose complicating pregnancy: Secondary | ICD-10-CM | POA: Insufficient documentation

## 2012-05-01 DIAGNOSIS — E669 Obesity, unspecified: Secondary | ICD-10-CM | POA: Insufficient documentation

## 2012-05-01 DIAGNOSIS — O99210 Obesity complicating pregnancy, unspecified trimester: Secondary | ICD-10-CM | POA: Insufficient documentation

## 2012-05-01 DIAGNOSIS — O352XX0 Maternal care for (suspected) hereditary disease in fetus, not applicable or unspecified: Secondary | ICD-10-CM | POA: Insufficient documentation

## 2012-05-01 DIAGNOSIS — O24919 Unspecified diabetes mellitus in pregnancy, unspecified trimester: Secondary | ICD-10-CM

## 2012-05-01 DIAGNOSIS — O3660X0 Maternal care for excessive fetal growth, unspecified trimester, not applicable or unspecified: Secondary | ICD-10-CM | POA: Insufficient documentation

## 2012-05-01 NOTE — Progress Notes (Signed)
Sara Gibbs  was seen today for an ultrasound appointment.  See full report in [facility].  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 Recommend twice weekly nonstress tests with amniotic fluid volume assessment.   Alpha Gula, MD

## 2012-05-05 ENCOUNTER — Other Ambulatory Visit (HOSPITAL_COMMUNITY)
Admission: RE | Admit: 2012-05-05 | Discharge: 2012-05-05 | Disposition: A | Discharge status: Home / Self Care | Payer: Medicaid Other | Source: Ambulatory Visit | Attending: Obstetrics and Gynecology | Admitting: Obstetrics and Gynecology

## 2012-05-05 ENCOUNTER — Ambulatory Visit (INDEPENDENT_AMBULATORY_CARE_PROVIDER_SITE_OTHER): Payer: Medicaid Other | Admitting: Obstetrics and Gynecology

## 2012-05-05 ENCOUNTER — Encounter: Payer: Self-pay | Admitting: Obstetrics and Gynecology

## 2012-05-05 VITALS — BP 114/77 | Temp 97.0°F | Wt 223.8 lb

## 2012-05-05 DIAGNOSIS — Z113 Encounter for screening for infections with a predominantly sexual mode of transmission: Secondary | ICD-10-CM | POA: Insufficient documentation

## 2012-05-05 DIAGNOSIS — D573 Sickle-cell trait: Secondary | ICD-10-CM

## 2012-05-05 DIAGNOSIS — O0930 Supervision of pregnancy with insufficient antenatal care, unspecified trimester: Secondary | ICD-10-CM

## 2012-05-05 DIAGNOSIS — O24919 Unspecified diabetes mellitus in pregnancy, unspecified trimester: Secondary | ICD-10-CM

## 2012-05-05 LAB — POCT URINALYSIS DIP (DEVICE)
Nitrite: NEGATIVE
Urobilinogen, UA: 0.2 mg/dL (ref 0.0–1.0)
pH: 7 (ref 5.0–8.0)

## 2012-05-05 NOTE — Progress Notes (Signed)
Pulse 111  

## 2012-05-05 NOTE — Progress Notes (Signed)
NST reviewed and reactive. CBG fasting 71-92; 2hr pp 82-140 (most within range). Cultures collected. FM/PTL precautions reviewed

## 2012-05-06 ENCOUNTER — Encounter: Payer: Self-pay | Admitting: *Deleted

## 2012-05-07 ENCOUNTER — Encounter: Payer: Self-pay | Admitting: *Deleted

## 2012-05-07 ENCOUNTER — Encounter: Payer: Self-pay | Admitting: Obstetrics and Gynecology

## 2012-05-08 ENCOUNTER — Ambulatory Visit (INDEPENDENT_AMBULATORY_CARE_PROVIDER_SITE_OTHER): Payer: Medicaid Other | Admitting: *Deleted

## 2012-05-08 VITALS — BP 112/68 | Temp 98.8°F | Wt 227.4 lb

## 2012-05-08 DIAGNOSIS — O24919 Unspecified diabetes mellitus in pregnancy, unspecified trimester: Secondary | ICD-10-CM

## 2012-05-08 NOTE — Progress Notes (Signed)
P=78,

## 2012-05-08 NOTE — Progress Notes (Signed)
Pt reported increased sensitivity and tenderness to touch of abdomen during AFI.  Pt's c/o reported to Dr. Penne Lash- moderate CVA tenderness and tenderness to abdominal palpation. Pt was offered to have repeat renal US but declined and desired to go home to rest.  Pt instructed to return to MAU if her pain increases or has sx of labor.  Pt voiced understanding.

## 2012-05-10 ENCOUNTER — Inpatient Hospital Stay (HOSPITAL_COMMUNITY)
Admission: AD | Admit: 2012-05-10 | Discharge: 2012-05-15 | DRG: 765 | Disposition: A | Discharge status: Home / Self Care | Payer: Medicaid Other | Source: Ambulatory Visit | Attending: Obstetrics & Gynecology | Admitting: Obstetrics & Gynecology

## 2012-05-10 ENCOUNTER — Encounter (HOSPITAL_COMMUNITY): Payer: Self-pay | Admitting: *Deleted

## 2012-05-10 DIAGNOSIS — O324XX0 Maternal care for high head at term, not applicable or unspecified: Secondary | ICD-10-CM | POA: Diagnosis present

## 2012-05-10 DIAGNOSIS — B356 Tinea cruris: Secondary | ICD-10-CM

## 2012-05-10 DIAGNOSIS — O24919 Unspecified diabetes mellitus in pregnancy, unspecified trimester: Secondary | ICD-10-CM | POA: Diagnosis present

## 2012-05-10 DIAGNOSIS — O4290 Premature rupture of membranes, unspecified as to length of time between rupture and onset of labor, unspecified weeks of gestation: Principal | ICD-10-CM | POA: Diagnosis present

## 2012-05-10 DIAGNOSIS — O99814 Abnormal glucose complicating childbirth: Secondary | ICD-10-CM | POA: Diagnosis present

## 2012-05-10 DIAGNOSIS — O0930 Supervision of pregnancy with insufficient antenatal care, unspecified trimester: Secondary | ICD-10-CM

## 2012-05-10 DIAGNOSIS — O9902 Anemia complicating childbirth: Secondary | ICD-10-CM | POA: Diagnosis present

## 2012-05-10 DIAGNOSIS — D573 Sickle-cell trait: Secondary | ICD-10-CM | POA: Diagnosis present

## 2012-05-10 DIAGNOSIS — Z98891 History of uterine scar from previous surgery: Secondary | ICD-10-CM

## 2012-05-10 HISTORY — DX: Gestational diabetes mellitus in pregnancy, unspecified control: O24.419

## 2012-05-10 LAB — CBC
MCH: 23.6 pg — ABNORMAL LOW (ref 26.0–34.0)
MCHC: 31.5 g/dL (ref 30.0–36.0)
MCV: 75 fL — ABNORMAL LOW (ref 78.0–100.0)
Platelets: 236 10*3/uL (ref 150–400)
RBC: 4.44 MIL/uL (ref 3.87–5.11)
RDW: 18.2 % — ABNORMAL HIGH (ref 11.5–15.5)

## 2012-05-10 LAB — GLUCOSE, CAPILLARY
Glucose-Capillary: 73 mg/dL (ref 70–99)
Glucose-Capillary: 117 mg/dL — ABNORMAL HIGH (ref 70–99)
Glucose-Capillary: 67 mg/dL — ABNORMAL LOW (ref 70–99)
Glucose-Capillary: 88 mg/dL (ref 70–99)

## 2012-05-10 LAB — TYPE AND SCREEN: ABO/RH(D): AB POS

## 2012-05-10 MED ORDER — TERBUTALINE SULFATE 1 MG/ML IJ SOLN: 0.2500 mg | Freq: Once | INTRAMUSCULAR | Status: AC | PRN

## 2012-05-10 MED ORDER — OXYTOCIN 40 UNITS IN LACTATED RINGERS INFUSION - SIMPLE MED
1.0000 m[IU]/min | INTRAVENOUS | Status: DC
  Administered 2012-05-10: 2 m[IU]/min via INTRAVENOUS
  Filled 2012-05-10: qty 1000

## 2012-05-10 MED ORDER — HYDROXYZINE HCL 50 MG/ML IM SOLN: 50.0000 mg | Freq: Four times a day (QID) | INTRAMUSCULAR | Status: DC | PRN

## 2012-05-10 MED ORDER — ACETAMINOPHEN 325 MG PO TABS
650.0000 mg | ORAL_TABLET | ORAL | Status: DC | PRN
  Administered 2012-05-10: 650 mg via ORAL
  Filled 2012-05-10: qty 2

## 2012-05-10 MED ORDER — ZOLPIDEM TARTRATE 5 MG PO TABS
5.0000 mg | ORAL_TABLET | Freq: Once | ORAL | Status: AC
  Administered 2012-05-11: 5 mg via ORAL
  Filled 2012-05-10: qty 1

## 2012-05-10 MED ORDER — CITRIC ACID-SODIUM CITRATE 334-500 MG/5ML PO SOLN
30.0000 mL | ORAL | Status: DC | PRN
  Administered 2012-05-12: 30 mL via ORAL
  Filled 2012-05-10 (×2): qty 15

## 2012-05-10 MED ORDER — NALBUPHINE SYRINGE 5 MG/0.5 ML
5.0000 mg | INJECTION | INTRAMUSCULAR | Status: DC | PRN
  Filled 2012-05-10: qty 0.5

## 2012-05-10 MED ORDER — ONDANSETRON HCL 4 MG/2ML IJ SOLN: 4.0000 mg | Freq: Four times a day (QID) | INTRAMUSCULAR | Status: DC | PRN

## 2012-05-10 MED ORDER — OXYCODONE-ACETAMINOPHEN 5-325 MG PO TABS: 1.0000 | ORAL_TABLET | ORAL | Status: DC | PRN

## 2012-05-10 MED ORDER — HYDROXYZINE HCL 50 MG PO TABS
50.0000 mg | ORAL_TABLET | Freq: Four times a day (QID) | ORAL | Status: DC | PRN
  Administered 2012-05-11: 50 mg via ORAL
  Filled 2012-05-10: qty 1

## 2012-05-10 MED ORDER — IBUPROFEN 600 MG PO TABS: 600.0000 mg | ORAL_TABLET | Freq: Four times a day (QID) | ORAL | Status: DC | PRN

## 2012-05-10 MED ORDER — LIDOCAINE HCL (PF) 1 % IJ SOLN
30.0000 mL | INTRAMUSCULAR | Status: DC | PRN
  Filled 2012-05-10: qty 30

## 2012-05-10 MED ORDER — OXYTOCIN 40 UNITS IN LACTATED RINGERS INFUSION - SIMPLE MED: 62.5000 mL/h | INTRAVENOUS | Status: DC

## 2012-05-10 MED ORDER — MISOPROSTOL 25 MCG QUARTER TABLET
25.0000 ug | ORAL_TABLET | ORAL | Status: DC | PRN
  Administered 2012-05-11 (×4): 25 ug via VAGINAL
  Filled 2012-05-10 (×4): qty 0.25

## 2012-05-10 MED ORDER — OXYTOCIN BOLUS FROM INFUSION: 500.0000 mL | INTRAVENOUS | Status: DC

## 2012-05-10 MED ORDER — LACTATED RINGERS IV SOLN
500.0000 mL | INTRAVENOUS | Status: DC | PRN
  Administered 2012-05-10: 500 mL via INTRAVENOUS
  Administered 2012-05-12: 1000 mL via INTRAVENOUS

## 2012-05-10 MED ORDER — LACTATED RINGERS IV SOLN
INTRAVENOUS | Status: DC
  Administered 2012-05-10: 17:00:00 via INTRAVENOUS
  Administered 2012-05-10: 125 mL/h via INTRAVENOUS
  Administered 2012-05-11 – 2012-05-12 (×6): via INTRAVENOUS

## 2012-05-10 MED ORDER — FLEET ENEMA 7-19 GM/118ML RE ENEM: 1.0000 | ENEMA | RECTAL | Status: DC | PRN

## 2012-05-10 NOTE — Progress Notes (Signed)
Sara Gibbs is a 27 y.o. G2P1001 at [redacted]w[redacted]d admitted for PPROM  Subjective: Feeling only rare uc's now, wants to get labor started  Objective: BP 114/73  Pulse 90  Temp 97.5 F (36.4 C) (Oral)  Resp 18  Ht 5\' 4"  (1.626 m)  Wt 102.967 kg (227 lb)  BMI 38.96 kg/m2  LMP 07/31/2011      FHT:  FHR: 165 bpm, variability: moderate,  accelerations:  Present,  decelerations:  Absent UC:   Mild, rare SVE:  Deferred at this time, RN to check before beginning pitocin per protocol Last SVE @ 1107: outer os ft, very high and posterior  Labs: Lab Results  Component Value Date   WBC 11.2* 05/10/2012   HGB 10.5* 05/10/2012   HCT 33.3* 05/10/2012   MCV 75.0* 05/10/2012   PLT 236 05/10/2012    Assessment / Plan: PPROM x 8hrs w/o SOL, pt requests to get labor started.  Will initiate pitocin per protocol  Labor: not yet Preeclampsia:  n/a Fetal Wellbeing:  Category II Pain Control:  Labor support without medications I/D:  n/a Anticipated MOD:  NSVD  Marge Duncans 05/10/2012, 5:29 PM

## 2012-05-10 NOTE — H&P (Signed)
History     CSN: [id number]  Arrival date and time: 05/10/12 1014   None     Chief Complaint  Patient presents with  . Labor Eval   HPI Sara Gibbs is a 27 y.o. G2P1001 female at [redacted]w[redacted]d by 6wk u/s, who presents w/ report of pop w/ leakage of clear fluid running down legs that began at 0910, had a little bloody appearance to it when she got in shower.  UCs throughout the night, no change since lof.  Good fm.  Denies frank vb. HA that began on way to hosp, none previously in pregnancy. Denies scotomata, ruq/epigastric pain, n/v.  Initiated pnc early in pregnancy w/ Dr. Dorn in [place], then transferred to [facility] at 27.6wks. A2DM, currently on glyburide 5mg am and 2.5mg pm.  Had A1C w/ Dr. Dorn 6.6, 1hr glucola on 11/14=330. 1/2 u/s @ 35.0wks, vtx, fhr 163, afi 10.09cm, efw 3175gm/7lb/>90% w/ >97% AC.  Pt and fob are pos for sickle cell trait, fetus also tested pos for trait on amnio that was requested by pt. No other genetic screening.  Normal anatomy scan.  H/O pos chlamydia 10/14/11, was negative on 03/31/12.  Bilateral non-obstructing renal collecting system calculi on u/s 9/22, to f/u pp per pt request d/t inability to pay for renal consult. SVD @ 40.0wks of 6lb 9oz baby w/o complications in 2008.  OB History    Grav Para Term Preterm Abortions TAB SAB Ect Mult Living   2 1 1       1      Past Medical History  Diagnosis Date  . GERD (gastroesophageal reflux disease)   . Ovarian cyst   . Migraines   . Seasonal allergies   . UTI (urinary tract infection) during pregnancy   . Chlamydia     Past Surgical History  Procedure Date  . Wisdom tooth extraction     Family History  Problem Relation Age of Onset  . Other Neg Hx   . Diabetes Father   . Sickle cell trait Father   . Diabetes Paternal Grandmother   . Cancer Mother     ovarian  . Cancer Maternal Grandmother   . Sickle cell trait Sister     paternal half-sister  . Sickle cell trait Daughter     History    Substance Use Topics  . Smoking status: Never Smoker   . Smokeless tobacco: Never Used  . Alcohol Use: No    Allergies: No Known Allergies  Prescriptions prior to admission  Medication Sig Dispense Refill  . cyclobenzaprine (FLEXERIL) 10 MG tablet Take 1 tablet (10 mg total) by mouth 3 (three) times daily as needed for muscle spasms.  30 tablet  0  . flintstones complete (FLINTSTONES) 60 MG chewable tablet Chew 2 tablets by mouth every morning.      . glyBURIDE (DIABETA) 2.5 MG tablet Take 1-2 tablets (2.5-5 mg total) by mouth 2 (two) times daily with a meal. 2 tabs (5mg) q am and 1 tab (2.5mg) q hs  90 tablet  3  . nystatin cream (MYCOSTATIN) Apply topically 2 (two) times daily.  30 g  1  . pantoprazole (PROTONIX) 40 MG tablet Take 1 tablet (40 mg total) by mouth daily.  30 tablet  3  . ACCU-CHEK FASTCLIX LANCETS MISC 1 Units by Percutaneous route 4 (four) times daily.  100 each  12  . Elastic Bandages & Supports (COMFORT FIT MATERNITY SUPP LG) MISC 1 Units by Does not apply   route as needed (pregnancy related pain).  1 each  0  . glucose blood (ACCU-CHEK SMARTVIEW) test strip Check blood sugars 4x/daily  100 each  12    Review of Systems  Constitutional: Negative.  Negative for fever and chills.  Eyes: Negative.   Respiratory: Negative.   Cardiovascular: Negative.   Gastrointestinal: Negative for nausea and vomiting. Abdominal pain: r/t uc's.  Genitourinary: Negative.   Musculoskeletal: Negative.   Skin: Negative.   Neurological: Positive for headaches (began on way to hosp, none previously).  Endo/Heme/Allergies: Negative.   Psychiatric/Behavioral: Negative.    Physical Exam   Last menstrual period 07/31/2011.  Physical Exam  Constitutional: She is oriented to person, place, and time. She appears well-developed and well-nourished.  HENT:  Head: Normocephalic.  Neck: Normal range of motion.  Cardiovascular: Regular rhythm.        Tachycardic in 130's, has been throughout  pregnancy  Respiratory: Effort normal and breath sounds normal.  GI: Soft. She exhibits no distension. There is no tenderness.       gravid  Genitourinary:       Fluid on perineum, slide collected by RN: fern pos SVE: only able to catch outer os, ft, very high and posterior, unable to determine presenting part. BS u/s: vtx  Musculoskeletal: Normal range of motion. She exhibits edema (1+ BLE).  Neurological: She is alert and oriented to person, place, and time. She has normal reflexes.       No clonus  Skin: Skin is warm and dry.  Psychiatric: She has a normal mood and affect. Her behavior is normal. Judgment and thought content normal.   FHR: 170, mod variability, 10x10accels, no decels= Cat II UCs: mild, irregular, q 2-6  MAU Course  Procedures  NST Slide for fern: pos SVE: outer os ft, very high and posterior B/S u/s to confirm presentation- vtx  Assessment and Plan  A:  [redacted]w[redacted]d SIUP  PPROM x 1.5hrs  A2DM- glyburide 5mg in am, 2.5 q hs  Cat II FHR  GBS neg   LGA, EFW >90% and AC>97% @ 35wks  Pt, fob, and fetus + sickle cell trait  Bilateral renal calculi- not affecting pregnancy at this time  P:  Admit to BS  Expectant management for now  May have iv pain meds prn, epidural in active labor at pt request  CBGs q 4hr while in latent phase, q 2hrs active phase  Glucostabilizer if cbg >110  Anticipate NSVD   Dhalia Zingaro Randall 05/10/2012, 10:48 AM  

## 2012-05-10 NOTE — MAU Note (Signed)
Had been having some light contractions during the night, got up this morning around 0910- felt a pop then started leaking clear fluid, still coming. Noted a little blood when in the shower.  Is a gest diabetic on oral meds.

## 2012-05-10 NOTE — H&P (Signed)
Attestation of Attending Supervision of Advanced Practitioner (PA/CNM/NP): Evaluation and management procedures were performed by the Advanced Practitioner under my supervision and collaboration.  I have reviewed the Advanced Practitioner's note and chart, and I agree with the management and plan.  Christain Niznik, MD, FACOG Attending Obstetrician & Gynecologist Faculty Practice, [facility]  

## 2012-05-10 NOTE — MAU Provider Note (Signed)
Attestation of Attending Supervision of Advanced Practitioner (PA/CNM/NP): Evaluation and management procedures were performed by the Advanced Practitioner under my supervision and collaboration.  I have reviewed the Advanced Practitioner's note and chart, and I agree with the management and plan.  Gottfried Standish, MD, FACOG Attending Obstetrician & Gynecologist Faculty Practice, [facility]  

## 2012-05-10 NOTE — Progress Notes (Signed)
Sara Gibbs is a 27 y.o. G2P1001 at [redacted]w[redacted]d admitted for PPROM  Subjective: Uncomfortable w/ uc's, breathing well. Continues to leak fluid. FOB supportive at bs.  Objective: BP 94/57  Pulse 99  Temp 98.2 F (36.8 C) (Oral)  Resp 20  Ht 5\' 4"  (1.626 m)  Wt 102.967 kg (227 lb)  BMI 38.96 kg/m2  LMP 07/31/2011     No abdominal tenderness FHT:  FHR: 170 bpm, variability: min-mod,  accelerations:  Abscent,  decelerations:  Present lates UC:   regular, every 2-4 minutes SVE:   Outer os ft, unable to reach inner os or determine effacement of station, very high and posterior  Pitocin currently at 34mu/min- turned off   Labs: Lab Results  Component Value Date   WBC 11.2* 05/10/2012   HGB 10.5* 05/10/2012   HCT 33.3* 05/10/2012   MCV 75.0* 05/10/2012   PLT 236 05/10/2012    Assessment / Plan: IOL d/t PPROM @ 0910, not currently in labor.  FHR baseline ranging from 155-170 since admission, now having lates on pitocin @ 83mu/min.  Pt afebrile w/o tachycardia and no abdominal tenderness. Pitocin turned off for now.  When fhr reassuring x 60min-1hr, begin cytotec per protocol.  D/T SVE feel that cervical foley bulb not an option at this time.  Will attempt when cervix more favorable.   Labor: not yet Preeclampsia:  n/a Fetal Wellbeing:  Category II Pain Control:  Labor support without medications I/D:  n/a Anticipated MOD:  NSVD  Discussed w/ Dr. Alfredo Martinez, Cheron Every 05/10/2012, 10:33 PM

## 2012-05-10 NOTE — Progress Notes (Signed)
300 cc iv bolus given

## 2012-05-10 NOTE — MAU Provider Note (Signed)
History     CSN: [id number]  Arrival date and time: 05/10/12 1014   None     Chief Complaint  Patient presents with  . Labor Eval   HPI Sara Gibbs is a 27 y.o. G2P1001 female at [redacted]w[redacted]d by 6wk u/s, who presents w/ report of pop w/ leakage of clear fluid running down legs that began at 0910, had a little bloody appearance to it when she got in shower.  UCs throughout the night, no change since lof.  Good fm.  Denies frank vb. HA that began on way to hosp, none previously in pregnancy. Denies scotomata, ruq/epigastric pain, n/v.  Initiated pnc early in pregnancy w/ Dr. Shawnie Pons in [facility], then transferred to [facility] at 27.6wks. A2DM, currently on glyburide 5mg  am and 2.5mg  pm.  Had A1C w/ Dr. Shawnie Pons 6.6, 1hr glucola on 11/14=330. 1/2 u/s @ 35.0wks, vtx, fhr 163, afi 10.09cm, efw 3175gm/7lb/>90% w/ >97% AC.  Pt and fob are pos for sickle cell trait, fetus also tested pos for trait on amnio that was requested by pt. No other genetic screening.  Normal anatomy scan.  H/O pos chlamydia 10/14/11, was negative on 03/31/12.  Bilateral non-obstructing renal collecting system calculi on u/s 9/22, to f/u pp per pt request d/t inability to pay for renal consult. SVD @ 40.0wks of 6lb 9oz baby w/o complications in 2008.  OB History    Grav Para Term Preterm Abortions TAB SAB Ect Mult Living   2 1 1       1       Past Medical History  Diagnosis Date  . GERD (gastroesophageal reflux disease)   . Ovarian cyst   . Migraines   . Seasonal allergies   . UTI (urinary tract infection) during pregnancy   . Chlamydia     Past Surgical History  Procedure Date  . Wisdom tooth extraction     Family History  Problem Relation Age of Onset  . Other Neg Hx   . Diabetes Father   . Sickle cell trait Father   . Diabetes Paternal Grandmother   . Cancer Mother     ovarian  . Cancer Maternal Grandmother   . Sickle cell trait Sister     paternal half-sister  . Sickle cell trait Daughter     History    Substance Use Topics  . Smoking status: Never Smoker   . Smokeless tobacco: Never Used  . Alcohol Use: No    Allergies: No Known Allergies  Prescriptions prior to admission  Medication Sig Dispense Refill  . cyclobenzaprine (FLEXERIL) 10 MG tablet Take 1 tablet (10 mg total) by mouth 3 (three) times daily as needed for muscle spasms.  30 tablet  0  . flintstones complete (FLINTSTONES) 60 MG chewable tablet Chew 2 tablets by mouth every morning.      . glyBURIDE (DIABETA) 2.5 MG tablet Take 1-2 tablets (2.5-5 mg total) by mouth 2 (two) times daily with a meal. 2 tabs (5mg ) q am and 1 tab (2.5mg ) q hs  90 tablet  3  . nystatin cream (MYCOSTATIN) Apply topically 2 (two) times daily.  30 g  1  . pantoprazole (PROTONIX) 40 MG tablet Take 1 tablet (40 mg total) by mouth daily.  30 tablet  3  . ACCU-CHEK FASTCLIX LANCETS MISC 1 Units by Percutaneous route 4 (four) times daily.  100 each  12  . Elastic Bandages & Supports (COMFORT FIT MATERNITY SUPP LG) MISC 1 Units by Does not apply  route as needed (pregnancy related pain).  1 each  0  . glucose blood (ACCU-CHEK SMARTVIEW) test strip Check blood sugars 4x/daily  100 each  12    Review of Systems  Constitutional: Negative.  Negative for fever and chills.  Eyes: Negative.   Respiratory: Negative.   Cardiovascular: Negative.   Gastrointestinal: Negative for nausea and vomiting. Abdominal pain: r/t uc's.  Genitourinary: Negative.   Musculoskeletal: Negative.   Skin: Negative.   Neurological: Positive for headaches (began on way to hosp, none previously).  Endo/Heme/Allergies: Negative.   Psychiatric/Behavioral: Negative.    Physical Exam   Last menstrual period 07/31/2011.  Physical Exam  Constitutional: She is oriented to person, place, and time. She appears well-developed and well-nourished.  HENT:  Head: Normocephalic.  Neck: Normal range of motion.  Cardiovascular: Regular rhythm.        Tachycardic in 130's, has been throughout  pregnancy  Respiratory: Effort normal and breath sounds normal.  GI: Soft. She exhibits no distension. There is no tenderness.       gravid  Genitourinary:       Fluid on perineum, slide collected by RN: fern pos SVE: only able to catch outer os, ft, very high and posterior, unable to determine presenting part. BS u/s: vtx  Musculoskeletal: Normal range of motion. She exhibits edema (1+ BLE).  Neurological: She is alert and oriented to person, place, and time. She has normal reflexes.       No clonus  Skin: Skin is warm and dry.  Psychiatric: She has a normal mood and affect. Her behavior is normal. Judgment and thought content normal.   FHR: 170, mod variability, 10x10accels, no decels= Cat II UCs: mild, irregular, q 2-6  MAU Course  Procedures  NST Slide for fern: pos SVE: outer os ft, very high and posterior B/S u/s to confirm presentation- vtx  Assessment and Plan  A:  109w2d SIUP  PPROM x 1.5hrs  A2DM- glyburide 5mg  in am, 2.5 q hs  Cat II FHR  GBS neg   LGA, EFW >90% and AC>97% @ 35wks  Pt, fob, and fetus + sickle cell trait  Bilateral renal calculi- not affecting pregnancy at this time  P:  Admit to BS  Expectant management for now  May have iv pain meds prn, epidural in active labor at pt request  CBGs q 4hr while in latent phase, q 2hrs active phase  Glucostabilizer if cbg >110  Anticipate NSVD   Marge Duncans 05/10/2012, 10:48 AM

## 2012-05-11 ENCOUNTER — Encounter (HOSPITAL_COMMUNITY): Payer: Self-pay | Admitting: Anesthesiology

## 2012-05-11 LAB — GLUCOSE, CAPILLARY
Glucose-Capillary: 81 mg/dL (ref 70–99)
Glucose-Capillary: 111 mg/dL — ABNORMAL HIGH (ref 70–99)
Glucose-Capillary: 58 mg/dL — ABNORMAL LOW (ref 70–99)

## 2012-05-11 LAB — CBC
Hemoglobin: 10.6 g/dL — ABNORMAL LOW (ref 12.0–15.0)
MCH: 24 pg — ABNORMAL LOW (ref 26.0–34.0)
MCHC: 31.9 g/dL (ref 30.0–36.0)
MCV: 75.1 fL — ABNORMAL LOW (ref 78.0–100.0)
RBC: 4.42 MIL/uL (ref 3.87–5.11)

## 2012-05-11 LAB — RPR: RPR Ser Ql: NONREACTIVE

## 2012-05-11 MED ORDER — NALBUPHINE SYRINGE 5 MG/0.5 ML
10.0000 mg | INJECTION | Freq: Once | INTRAMUSCULAR | Status: AC
  Administered 2012-05-11: 10 mg via INTRAVENOUS
  Filled 2012-05-11 (×2): qty 0.5

## 2012-05-11 MED ORDER — PHENYLEPHRINE 40 MCG/ML (10ML) SYRINGE FOR IV PUSH (FOR BLOOD PRESSURE SUPPORT)
80.0000 ug | PREFILLED_SYRINGE | INTRAVENOUS | Status: DC | PRN
  Filled 2012-05-11: qty 5

## 2012-05-11 MED ORDER — NALBUPHINE SYRINGE 5 MG/0.5 ML
10.0000 mg | INJECTION | Freq: Once | INTRAMUSCULAR | Status: AC
  Administered 2012-05-11: 10 mg via INTRAMUSCULAR
  Filled 2012-05-11: qty 0.5

## 2012-05-11 MED ORDER — DIPHENHYDRAMINE HCL 50 MG/ML IJ SOLN: 12.5000 mg | INTRAMUSCULAR | Status: DC | PRN

## 2012-05-11 MED ORDER — LACTATED RINGERS IV SOLN
500.0000 mL | Freq: Once | INTRAVENOUS | Status: AC
  Administered 2012-05-11: 500 mL via INTRAVENOUS

## 2012-05-11 MED ORDER — FENTANYL 2.5 MCG/ML BUPIVACAINE 1/10 % EPIDURAL INFUSION (WH - ANES)
14.0000 mL/h | INTRAMUSCULAR | Status: DC
  Administered 2012-05-12: 14 mL/h via EPIDURAL
  Filled 2012-05-11 (×2): qty 125

## 2012-05-11 MED ORDER — PHENYLEPHRINE 40 MCG/ML (10ML) SYRINGE FOR IV PUSH (FOR BLOOD PRESSURE SUPPORT): 80.0000 ug | PREFILLED_SYRINGE | INTRAVENOUS | Status: DC | PRN

## 2012-05-11 MED ORDER — EPHEDRINE 5 MG/ML INJ
10.0000 mg | INTRAVENOUS | Status: DC | PRN
  Filled 2012-05-11: qty 4

## 2012-05-11 MED ORDER — EPHEDRINE 5 MG/ML INJ: 10.0000 mg | INTRAVENOUS | Status: DC | PRN

## 2012-05-11 MED ORDER — FENTANYL CITRATE 0.05 MG/ML IJ SOLN
100.0000 ug | INTRAMUSCULAR | Status: DC | PRN
  Administered 2012-05-11 (×2): 100 ug via INTRAVENOUS
  Filled 2012-05-11 (×2): qty 2

## 2012-05-11 MED ORDER — HYDROXYZINE HCL 25 MG PO TABS
25.0000 mg | ORAL_TABLET | Freq: Three times a day (TID) | ORAL | Status: DC | PRN
  Filled 2012-05-11: qty 1

## 2012-05-11 NOTE — Anesthesia Preprocedure Evaluation (Signed)
Anesthesia Evaluation  Patient identified by MRN, date of birth, ID band Patient awake    Reviewed: Allergy & Precautions, H&P , Patient's Chart, lab work & pertinent test results  Airway Mallampati: III TM Distance: >3 FB Neck ROM: full    Dental No notable dental hx. (+) Teeth Intact   Pulmonary neg pulmonary ROS,  breath sounds clear to auscultation  Pulmonary exam normal       Cardiovascular negative cardio ROS  Rhythm:regular Rate:Normal     Neuro/Psych  Headaches, negative psych ROS   GI/Hepatic Neg liver ROS, GERD-  Medicated and Controlled,  Endo/Other  diabetes, Well Controlled, Gestational, Oral Hypoglycemic AgentsMorbid obesity  Renal/GU negative Renal ROS  negative genitourinary   Musculoskeletal   Abdominal Normal abdominal exam  (+)   Peds  Hematology  (+) Sickle cell trait ,   Anesthesia Other Findings   Reproductive/Obstetrics (+) Pregnancy                           Anesthesia Physical Anesthesia Plan  ASA: III  Anesthesia Plan: Epidural   Post-op Pain Management:    Induction:   Airway Management Planned:   Additional Equipment:   Intra-op Plan:   Post-operative Plan:   Informed Consent: I have reviewed the patients History and Physical, chart, labs and discussed the procedure including the risks, benefits and alternatives for the proposed anesthesia with the patient or authorized representative who has indicated his/her understanding and acceptance.     Plan Discussed with: Anesthesiologist  Anesthesia Plan Comments:         Anesthesia Quick Evaluation

## 2012-05-11 NOTE — Progress Notes (Signed)
KYLE STANSELL is a 27 y.o. G2P1001 at [redacted]w[redacted]d admitted for induction of labor due to PPROM.  Subjective: Uncomfortable w/ uc's, breathes well. Doesn't want any pain meds at this time Wants to eat a light meal  Objective: BP 89/47  Pulse 94  Temp 98.8 F (37.1 C) (Oral)  Resp 18  Ht 5\' 4"  (1.626 m)  Wt 102.967 kg (227 lb)  BMI 38.96 kg/m2  LMP 07/31/2011      FHT:  160, min variability, 10x10 accels, no decels= Cat II UC:   regular, every 2-4 minutes SVE:   Dilation: Fingertip Effacement (%): Thick Station: -3 Exam by:: A.Davis,RN Nurse states unable to reach cervix BS u/s to confirm presentation- vtx  Labs: Lab Results  Component Value Date   WBC 11.2* 05/10/2012   HGB 10.5* 05/10/2012   HCT 33.3* 05/10/2012   MCV 75.0* 05/10/2012   PLT 236 05/10/2012    Assessment / Plan: IOL d/t PPROM 1/11 @ 0910, s/p 2nd dose of cytotec at 0420 Light laboring diet until pitocin CBGs <110  Labor: not yet Preeclampsia:  n/a Fetal Wellbeing:  Category II Pain Control:  Labor support without medications I/D:  n/a Anticipated MOD:  NSVD  Marge Duncans 05/11/2012, 6:09 AM

## 2012-05-11 NOTE — Progress Notes (Signed)
Patient ID: Sara Gibbs, female   DOB: [birth year], 27 y.o.   MRN: [id number]   S:  Pt states she is feeling ctx. Moaned through one while I was in the room.  O: Filed Vitals:   05/11/12 1100 05/11/12 1153 05/11/12 1230 05/11/12 1300  BP:  111/65    Pulse:  112    Temp:      TempSrc:      Resp: 18 20 18 18   Height:      Weight:        Cervix:  1-1.5/50/-3, still very posterior.  FHTs:  150-160, mod var, accels present, one or two subtle lates, one early.  TOCO:  q 3-5 minutes  A/P Some progress in cervical change.  Attempted foley bulb, too posterior, patient did not tolerate procedure. Will redose cytotec.  Napoleon Form, MD 05/11/2012 1:51 PM

## 2012-05-11 NOTE — Progress Notes (Signed)
Subjective: Pt states she is some pain and having occasional ctx.  Denies fever or chills at this point  Objective: BP 101/67  Pulse 102  Temp 98.7 F (37.1 C) (Axillary)  Resp 16  Ht 5\' 4"  (1.626 m)  Wt 102.967 kg (227 lb)  BMI 38.96 kg/m2  LMP 07/31/2011      FHT:  FHR: 175 bpm, variability: minimal ,  accelerations:  Abscent,  decelerations:  Absent UC:   irregular, every 3-5 minutes SVE:   Dilation: 1.5 Effacement (%): 70 Station: -3 Exam by:: [employer]: Lab Results  Component Value Date   WBC 11.2* 05/10/2012   HGB 10.5* 05/10/2012   HCT 33.3* 05/10/2012   MCV 75.0* 05/10/2012   PLT 236 05/10/2012    Assessment / Plan: Spontaneous labor, progressing normally.  Continue monitoring closely, especially considering tachycardia of fetus.  Watch for Fever for chorio.    Labor: Progressing normally, foley bulb in place, Continue Pitocin infusion  Preeclampsia:  N/A Fetal Wellbeing:  Category II Pain Control:  Fentanyl I/D:  n/a Anticipated MOD:  NSVD  Gildardo Cranker 05/11/2012, 9:09 PM

## 2012-05-11 NOTE — Progress Notes (Signed)
Patient ID: [name], female   DOB: 19-Aug-1985, 26 y.o.   MRN: [id number]  S:  Pt uncomfortable after one dose of cytotec. States ctx q 3 minutes.  OCeasar Mons Vitals:   05/11/12 0412 05/11/12 0600 05/11/12 0735 05/11/12 0840  BP: 89/47   110/71  Pulse: 94   89  Temp:  98.8 F (37.1 C)    TempSrc:  Oral    Resp:   18   Height:      Weight:        Cervix:  Very posterior difficult check. Cannot get finger through internal os. Possibly FT/long/high  FHTs:  150-160, accels present, moderate variability, no decels TOCO: q 2-3, not tracing well.  A/P 28 y.o. G2P1001 at [redacted]w[redacted]d with PROM. Unfavorable cervix. Unable to get FB in (internal os closed, too posterior).  Will give second dose of cytotec. Fetal tachycardia - 1501-60 now.  Afebrile. Watch for signs of chorio.  Joaquim Lai 05/11/2012 9:11 AM

## 2012-05-11 NOTE — Progress Notes (Signed)
Sara Gibbs is a 27 y.o. G2P1001 at [redacted]w[redacted]d admitted for PPROM  Subjective: UCs still slightly uncomfortable.  FOB supportive at bs.   Objective: BP 94/57  Pulse 99  Temp 98.2 F (36.8 C) (Oral)  Resp 20  Ht 5\' 4"  (1.626 m)  Wt 102.967 kg (227 lb)  BMI 38.96 kg/m2  LMP 07/31/2011      FHT:  FHR: 165 bpm, variability: moderate,  accelerations:  Abscent,  decelerations:  Present occ variable UC:   irregular, every 2-5 minutes SVE:  cytotec placed  Labs: Lab Results  Component Value Date   WBC 11.2* 05/10/2012   HGB 10.5* 05/10/2012   HCT 33.3* 05/10/2012   MCV 75.0* 05/10/2012   PLT 236 05/10/2012    Assessment / Plan: IOL d/t PPROM 1/11 @ 0910. Lates have resolved. Cytotec placed  Labor: not yet Preeclampsia:  n/a Fetal Wellbeing:  Category II Pain Control:  Labor support without medications I/D:  n/a Anticipated MOD:  NSVD  Marge Duncans 05/11/2012, 12:21 AM

## 2012-05-11 NOTE — Progress Notes (Signed)
Sara Gibbs is a 27 y.o. G2P1001 at [redacted]w[redacted]d by ultrasound admitted for rupture of membranes  Subjective:   Objective: BP 101/67  Pulse 102  Temp 98.2 F (36.8 C) (Axillary)  Resp 18  Ht 5\' 4"  (1.626 m)  Wt 227 lb (102.967 kg)  BMI 38.96 kg/m2  LMP 07/31/2011      FHT:  FHR: 170 bpm, variability: minimal ,  accelerations:  Abscent,  decelerations:  Present vaariable UC:   regular, every 3 minutes SVE:   Dilation: 1.5 Effacement (%): 70 Station: -3 Exam by:: [place] Foley bulb placed 1950 Labs: Lab Results  Component Value Date   WBC 11.2* 05/10/2012   HGB 10.5* 05/10/2012   HCT 33.3* 05/10/2012   MCV 75.0* 05/10/2012   PLT 236 05/10/2012    Assessment / Plan: Protracted latent phase  Labor: cervical ripening Preeclampsia:  no signs or symptoms of toxicity Fetal Wellbeing:  Category II I/D:  follow temp, check CBC Anticipated MOD:  NSVD  ARNOLD,JAMES 05/11/2012, 10:37 PM

## 2012-05-12 ENCOUNTER — Encounter (HOSPITAL_COMMUNITY): Payer: Self-pay

## 2012-05-12 ENCOUNTER — Other Ambulatory Visit: Payer: Medicaid Other

## 2012-05-12 ENCOUNTER — Ambulatory Visit (HOSPITAL_COMMUNITY): Admission: RE | Admit: 2012-05-12 | Payer: Medicaid Other | Source: Ambulatory Visit

## 2012-05-12 ENCOUNTER — Encounter (HOSPITAL_COMMUNITY)
Admission: AD | Disposition: A | Discharge status: Home / Self Care | Payer: Self-pay | Source: Ambulatory Visit | Attending: Obstetrics & Gynecology

## 2012-05-12 ENCOUNTER — Inpatient Hospital Stay (HOSPITAL_COMMUNITY): Payer: Medicaid Other | Admitting: Anesthesiology

## 2012-05-12 ENCOUNTER — Encounter (HOSPITAL_COMMUNITY): Payer: Self-pay | Admitting: Anesthesiology

## 2012-05-12 DIAGNOSIS — O324XX0 Maternal care for high head at term, not applicable or unspecified: Secondary | ICD-10-CM

## 2012-05-12 DIAGNOSIS — Z98891 History of uterine scar from previous surgery: Secondary | ICD-10-CM

## 2012-05-12 DIAGNOSIS — O4290 Premature rupture of membranes, unspecified as to length of time between rupture and onset of labor, unspecified weeks of gestation: Secondary | ICD-10-CM

## 2012-05-12 LAB — GLUCOSE, CAPILLARY: Glucose-Capillary: 104 mg/dL — ABNORMAL HIGH (ref 70–99)

## 2012-05-12 SURGERY — Surgical Case
Anesthesia: Epidural | Site: Abdomen | Wound class: Clean Contaminated

## 2012-05-12 MED ORDER — MEPERIDINE HCL 25 MG/ML IJ SOLN
INTRAMUSCULAR | Status: AC
  Filled 2012-05-12: qty 1

## 2012-05-12 MED ORDER — SENNOSIDES-DOCUSATE SODIUM 8.6-50 MG PO TABS
2.0000 | ORAL_TABLET | Freq: Every day | ORAL | Status: DC
  Administered 2012-05-12 – 2012-05-14 (×3): 2 via ORAL

## 2012-05-12 MED ORDER — IBUPROFEN 600 MG PO TABS
600.0000 mg | ORAL_TABLET | Freq: Four times a day (QID) | ORAL | Status: DC
  Administered 2012-05-13 – 2012-05-15 (×10): 600 mg via ORAL
  Filled 2012-05-12 (×9): qty 1

## 2012-05-12 MED ORDER — DIPHENHYDRAMINE HCL 50 MG/ML IJ SOLN
12.5000 mg | INTRAMUSCULAR | Status: DC | PRN
  Administered 2012-05-12: 12.5 mg via INTRAVENOUS
  Filled 2012-05-12: qty 1

## 2012-05-12 MED ORDER — NALOXONE HCL 0.4 MG/ML IJ SOLN: 0.4000 mg | INTRAMUSCULAR | Status: DC | PRN

## 2012-05-12 MED ORDER — SCOPOLAMINE 1 MG/3DAYS TD PT72
MEDICATED_PATCH | TRANSDERMAL | Status: AC
  Administered 2012-05-12: 1.5 mg via TRANSDERMAL
  Filled 2012-05-12: qty 1

## 2012-05-12 MED ORDER — SODIUM CHLORIDE 0.9 % IJ SOLN: 3.0000 mL | INTRAMUSCULAR | Status: DC | PRN

## 2012-05-12 MED ORDER — PRENATAL MULTIVITAMIN CH
1.0000 | ORAL_TABLET | Freq: Every day | ORAL | Status: DC
  Administered 2012-05-13 – 2012-05-15 (×3): 1 via ORAL
  Filled 2012-05-12 (×3): qty 1

## 2012-05-12 MED ORDER — CEFAZOLIN SODIUM-DEXTROSE 2-3 GM-% IV SOLR
INTRAVENOUS | Status: DC | PRN
  Administered 2012-05-12: 2 g via INTRAVENOUS

## 2012-05-12 MED ORDER — LACTATED RINGERS IV SOLN
INTRAVENOUS | Status: DC | PRN
  Administered 2012-05-12: 15:00:00 via INTRAVENOUS

## 2012-05-12 MED ORDER — OXYTOCIN 10 UNIT/ML IJ SOLN
40.0000 [IU] | INTRAVENOUS | Status: DC | PRN
  Administered 2012-05-12: 40 [IU] via INTRAVENOUS

## 2012-05-12 MED ORDER — MAGNESIUM HYDROXIDE 400 MG/5ML PO SUSP: 30.0000 mL | ORAL | Status: DC | PRN

## 2012-05-12 MED ORDER — OXYTOCIN 40 UNITS IN LACTATED RINGERS INFUSION - SIMPLE MED: 62.5000 mL/h | INTRAVENOUS | Status: AC

## 2012-05-12 MED ORDER — LACTATED RINGERS IV SOLN
INTRAVENOUS | Status: DC
  Administered 2012-05-12 (×2): via INTRAUTERINE

## 2012-05-12 MED ORDER — SODIUM BICARBONATE 8.4 % IV SOLN
INTRAVENOUS | Status: DC | PRN
  Administered 2012-05-12: 5 mL via EPIDURAL

## 2012-05-12 MED ORDER — ZOLPIDEM TARTRATE 5 MG PO TABS: 5.0000 mg | ORAL_TABLET | Freq: Every evening | ORAL | Status: DC | PRN

## 2012-05-12 MED ORDER — DIPHENHYDRAMINE HCL 50 MG/ML IJ SOLN: 25.0000 mg | INTRAMUSCULAR | Status: DC | PRN

## 2012-05-12 MED ORDER — SODIUM BICARBONATE 8.4 % IV SOLN
INTRAVENOUS | Status: AC
  Filled 2012-05-12: qty 50

## 2012-05-12 MED ORDER — MORPHINE SULFATE 0.5 MG/ML IJ SOLN
INTRAMUSCULAR | Status: AC
  Filled 2012-05-12: qty 10

## 2012-05-12 MED ORDER — MORPHINE SULFATE 10 MG/ML IJ SOLN: INTRAMUSCULAR | Status: DC | PRN

## 2012-05-12 MED ORDER — ONDANSETRON HCL 4 MG/2ML IJ SOLN: 4.0000 mg | INTRAMUSCULAR | Status: DC | PRN

## 2012-05-12 MED ORDER — SCOPOLAMINE 1 MG/3DAYS TD PT72
1.0000 | MEDICATED_PATCH | Freq: Once | TRANSDERMAL | Status: AC
  Administered 2012-05-12: 1.5 mg via TRANSDERMAL

## 2012-05-12 MED ORDER — BUPIVACAINE HCL (PF) 0.5 % IJ SOLN
INTRAMUSCULAR | Status: AC
  Filled 2012-05-12: qty 30

## 2012-05-12 MED ORDER — MORPHINE SULFATE (PF) 0.5 MG/ML IJ SOLN
INTRAMUSCULAR | Status: DC | PRN
  Administered 2012-05-12: 3 mg via EPIDURAL

## 2012-05-12 MED ORDER — OXYTOCIN 40 UNITS IN LACTATED RINGERS INFUSION - SIMPLE MED
1.0000 m[IU]/min | INTRAVENOUS | Status: DC
  Administered 2012-05-12: 1 m[IU]/min via INTRAVENOUS
  Filled 2012-05-12: qty 1000

## 2012-05-12 MED ORDER — MEPERIDINE HCL 25 MG/ML IJ SOLN
INTRAMUSCULAR | Status: DC | PRN
  Administered 2012-05-12 (×2): 12.5 mg via INTRAVENOUS

## 2012-05-12 MED ORDER — MEPERIDINE HCL 25 MG/ML IJ SOLN: 6.2500 mg | INTRAMUSCULAR | Status: DC | PRN

## 2012-05-12 MED ORDER — ONDANSETRON HCL 4 MG PO TABS: 4.0000 mg | ORAL_TABLET | ORAL | Status: DC | PRN

## 2012-05-12 MED ORDER — METOCLOPRAMIDE HCL 5 MG/ML IJ SOLN: 10.0000 mg | Freq: Three times a day (TID) | INTRAMUSCULAR | Status: DC | PRN

## 2012-05-12 MED ORDER — DIBUCAINE 1 % RE OINT: 1.0000 "application " | TOPICAL_OINTMENT | RECTAL | Status: DC | PRN

## 2012-05-12 MED ORDER — IBUPROFEN 600 MG PO TABS: 600.0000 mg | ORAL_TABLET | Freq: Four times a day (QID) | ORAL | Status: DC | PRN

## 2012-05-12 MED ORDER — NALOXONE HCL 1 MG/ML IJ SOLN: 1.0000 ug/kg/h | INTRAVENOUS | Status: DC | PRN

## 2012-05-12 MED ORDER — LIDOCAINE HCL (PF) 1 % IJ SOLN
INTRAMUSCULAR | Status: DC | PRN
  Administered 2012-05-11 – 2012-05-12 (×2): 4 mL

## 2012-05-12 MED ORDER — KETOROLAC TROMETHAMINE 30 MG/ML IJ SOLN
30.0000 mg | Freq: Four times a day (QID) | INTRAMUSCULAR | Status: AC | PRN
  Administered 2012-05-12: 30 mg via INTRAVENOUS
  Filled 2012-05-12: qty 1

## 2012-05-12 MED ORDER — KETOROLAC TROMETHAMINE 60 MG/2ML IM SOLN
60.0000 mg | Freq: Once | INTRAMUSCULAR | Status: AC | PRN
  Administered 2012-05-12: 60 mg via INTRAMUSCULAR

## 2012-05-12 MED ORDER — BUPIVACAINE HCL (PF) 0.5 % IJ SOLN
INTRAMUSCULAR | Status: DC | PRN
  Administered 2012-05-12: 30 mL

## 2012-05-12 MED ORDER — OXYTOCIN 10 UNIT/ML IJ SOLN
INTRAMUSCULAR | Status: AC
  Filled 2012-05-12: qty 4

## 2012-05-12 MED ORDER — DIPHENHYDRAMINE HCL 25 MG PO CAPS
25.0000 mg | ORAL_CAPSULE | ORAL | Status: DC | PRN
Start: 2012-05-12 — End: 2012-05-15
  Filled 2012-05-12: qty 1

## 2012-05-12 MED ORDER — SODIUM CHLORIDE 0.9 % IJ SOLN: 3.0000 mL | Freq: Two times a day (BID) | INTRAMUSCULAR | Status: DC

## 2012-05-12 MED ORDER — LACTATED RINGERS IV SOLN
INTRAVENOUS | Status: DC | PRN
  Administered 2012-05-12 (×2): via INTRAVENOUS

## 2012-05-12 MED ORDER — NALBUPHINE HCL 10 MG/ML IJ SOLN: 5.0000 mg | INTRAMUSCULAR | Status: DC | PRN

## 2012-05-12 MED ORDER — LIDOCAINE-EPINEPHRINE (PF) 2 %-1:200000 IJ SOLN
INTRAMUSCULAR | Status: AC
  Filled 2012-05-12: qty 20

## 2012-05-12 MED ORDER — FENTANYL 2.5 MCG/ML BUPIVACAINE 1/10 % EPIDURAL INFUSION (WH - ANES)
INTRAMUSCULAR | Status: DC | PRN
  Administered 2012-05-12: 14 mL/h via EPIDURAL

## 2012-05-12 MED ORDER — KETOROLAC TROMETHAMINE 30 MG/ML IJ SOLN: 30.0000 mg | Freq: Four times a day (QID) | INTRAMUSCULAR | Status: AC | PRN

## 2012-05-12 MED ORDER — METHYLERGONOVINE MALEATE 0.2 MG/ML IJ SOLN: 0.2000 mg | INTRAMUSCULAR | Status: DC | PRN

## 2012-05-12 MED ORDER — PHENYLEPHRINE HCL 10 MG/ML IJ SOLN
INTRAMUSCULAR | Status: DC | PRN
  Administered 2012-05-12: 80 ug via INTRAVENOUS

## 2012-05-12 MED ORDER — METHYLERGONOVINE MALEATE 0.2 MG PO TABS: 0.2000 mg | ORAL_TABLET | ORAL | Status: DC | PRN

## 2012-05-12 MED ORDER — OXYCODONE-ACETAMINOPHEN 5-325 MG PO TABS
1.0000 | ORAL_TABLET | ORAL | Status: DC | PRN
  Administered 2012-05-13 – 2012-05-14 (×2): 1 via ORAL
  Administered 2012-05-14: 2 via ORAL
  Administered 2012-05-14: 1 via ORAL
  Administered 2012-05-14 – 2012-05-15 (×2): 2 via ORAL
  Filled 2012-05-12 (×2): qty 2
  Filled 2012-05-12: qty 1
  Filled 2012-05-12: qty 2
  Filled 2012-05-12 (×2): qty 1

## 2012-05-12 MED ORDER — TETANUS-DIPHTH-ACELL PERTUSSIS 5-2.5-18.5 LF-MCG/0.5 IM SUSP: 0.5000 mL | Freq: Once | INTRAMUSCULAR | Status: DC

## 2012-05-12 MED ORDER — ONDANSETRON HCL 4 MG/2ML IJ SOLN
INTRAMUSCULAR | Status: DC | PRN
  Administered 2012-05-12: 4 mg via INTRAVENOUS

## 2012-05-12 MED ORDER — CEFAZOLIN SODIUM-DEXTROSE 2-3 GM-% IV SOLR
INTRAVENOUS | Status: AC
  Filled 2012-05-12: qty 50

## 2012-05-12 MED ORDER — SIMETHICONE 80 MG PO CHEW
80.0000 mg | CHEWABLE_TABLET | ORAL | Status: DC | PRN
  Administered 2012-05-12 – 2012-05-14 (×3): 80 mg via ORAL

## 2012-05-12 MED ORDER — FENTANYL CITRATE 0.05 MG/ML IJ SOLN
25.0000 ug | INTRAMUSCULAR | Status: DC | PRN
  Administered 2012-05-12 (×2): 50 ug via INTRAVENOUS

## 2012-05-12 MED ORDER — DIPHENHYDRAMINE HCL 25 MG PO CAPS: 25.0000 mg | ORAL_CAPSULE | Freq: Four times a day (QID) | ORAL | Status: DC | PRN

## 2012-05-12 MED ORDER — FENTANYL CITRATE 0.05 MG/ML IJ SOLN
INTRAMUSCULAR | Status: AC
  Administered 2012-05-12: 50 ug via INTRAVENOUS
  Filled 2012-05-12: qty 2

## 2012-05-12 MED ORDER — DEXAMETHASONE SODIUM PHOSPHATE 10 MG/ML IJ SOLN
INTRAMUSCULAR | Status: DC | PRN
  Administered 2012-05-12: 10 mg via INTRAVENOUS

## 2012-05-12 MED ORDER — ONDANSETRON HCL 4 MG/2ML IJ SOLN: 4.0000 mg | Freq: Three times a day (TID) | INTRAMUSCULAR | Status: DC | PRN

## 2012-05-12 MED ORDER — MENTHOL 3 MG MT LOZG: 1.0000 | LOZENGE | OROMUCOSAL | Status: DC | PRN

## 2012-05-12 MED ORDER — LANOLIN HYDROUS EX OINT: 1.0000 "application " | TOPICAL_OINTMENT | CUTANEOUS | Status: DC | PRN

## 2012-05-12 MED ORDER — WITCH HAZEL-GLYCERIN EX PADS: 1.0000 "application " | MEDICATED_PAD | CUTANEOUS | Status: DC | PRN

## 2012-05-12 MED ORDER — SODIUM CHLORIDE 0.9 % IV SOLN: 250.0000 mL | INTRAVENOUS | Status: DC

## 2012-05-12 MED ORDER — KETOROLAC TROMETHAMINE 60 MG/2ML IM SOLN
INTRAMUSCULAR | Status: AC
  Administered 2012-05-12: 60 mg via INTRAMUSCULAR
  Filled 2012-05-12: qty 2

## 2012-05-12 SURGICAL SUPPLY — 37 items
CLOTH BEACON ORANGE TIMEOUT ST (SAFETY) ×2 IMPLANT
DRAPE LG THREE QUARTER DISP (DRAPES) ×2 IMPLANT
DRESSING TELFA 8X3 (GAUZE/BANDAGES/DRESSINGS) ×2 IMPLANT
DRSG OPSITE POSTOP 4X10 (GAUZE/BANDAGES/DRESSINGS) IMPLANT
DURAPREP 26ML APPLICATOR (WOUND CARE) ×2 IMPLANT
ELECT REM PT RETURN 9FT ADLT (ELECTROSURGICAL) ×2
ELECTRODE REM PT RTRN 9FT ADLT (ELECTROSURGICAL) ×1 IMPLANT
EXTRACTOR VACUUM M CUP 4 TUBE (SUCTIONS) IMPLANT
GAUZE SPONGE 4X4 12PLY STRL LF (GAUZE/BANDAGES/DRESSINGS) ×4 IMPLANT
GLOVE BIO SURGEON STRL SZ 6.5 (GLOVE) ×4 IMPLANT
GLOVE BIO SURGEON STRL SZ7 (GLOVE) ×4 IMPLANT
GLOVE BIOGEL PI IND STRL 7.0 (GLOVE) ×4 IMPLANT
GLOVE BIOGEL PI INDICATOR 7.0 (GLOVE) ×4
GLOVE SURG SS PI 7.0 STRL IVOR (GLOVE) ×6 IMPLANT
GOWN STRL REIN XL XLG (GOWN DISPOSABLE) ×14 IMPLANT
KIT ABG SYR 3ML LUER SLIP (SYRINGE) IMPLANT
NEEDLE HYPO 22GX1.5 SAFETY (NEEDLE) ×2 IMPLANT
NEEDLE HYPO 25X5/8 SAFETYGLIDE (NEEDLE) ×2 IMPLANT
NS IRRIG 1000ML POUR BTL (IV SOLUTION) ×4 IMPLANT
PACK C SECTION WH (CUSTOM PROCEDURE TRAY) ×2 IMPLANT
PAD ABD 7.5X8 STRL (GAUZE/BANDAGES/DRESSINGS) IMPLANT
PAD OB MATERNITY 4.3X12.25 (PERSONAL CARE ITEMS) ×2 IMPLANT
RTRCTR C-SECT PINK 25CM LRG (MISCELLANEOUS) ×2 IMPLANT
SLEEVE SCD COMPRESS KNEE LRG (MISCELLANEOUS) ×2 IMPLANT
SLEEVE SCD COMPRESS KNEE MED (MISCELLANEOUS) IMPLANT
SPONGE LAP 18X18 X RAY DECT (DISPOSABLE) ×6 IMPLANT
STAPLER VISISTAT 35W (STAPLE) IMPLANT
SUT PDS AB 0 CT1 27 (SUTURE) ×2 IMPLANT
SUT PDS AB 0 CTX 36 PDP370T (SUTURE) ×4 IMPLANT
SUT VIC AB 0 CT1 36 (SUTURE) ×4 IMPLANT
SUT VIC AB 0 CTX 36 (SUTURE) ×3
SUT VIC AB 0 CTX36XBRD ANBCTRL (SUTURE) ×3 IMPLANT
SUT VIC AB 4-0 KS 27 (SUTURE) ×2 IMPLANT
SYR CONTROL 10ML LL (SYRINGE) ×2 IMPLANT
TOWEL OR 17X24 6PK STRL BLUE (TOWEL DISPOSABLE) ×6 IMPLANT
TRAY FOLEY CATH 14FR (SET/KITS/TRAYS/PACK) IMPLANT
WATER STERILE IRR 1000ML POUR (IV SOLUTION) ×2 IMPLANT

## 2012-05-12 NOTE — Anesthesia Postprocedure Evaluation (Signed)
Anesthesia Post Note  Patient: Sara Gibbs  Procedure(s) Performed: Procedure(s) (LRB): CESAREAN SECTION (N/A)  Anesthesia type: Epidural  Patient location: PACU  Post pain: Pain level controlled  Post assessment: Post-op Vital signs reviewed  Last Vitals:  Filed Vitals:   05/12/12 1745  BP: 117/77  Pulse: 77  Temp: 37.7 C  Resp: 16    Post vital signs: stable  Level of consciousness: awake  Complications: No apparent anesthesia complications

## 2012-05-12 NOTE — Anesthesia Procedure Notes (Signed)
Epidural Patient location during procedure: OB Start time: 05/11/2012 11:59 PM  Staffing Anesthesiologist: Onika Gudiel A. Performed by: anesthesiologist   Preanesthetic Checklist Completed: patient identified, site marked, surgical consent, pre-op evaluation, timeout performed, IV checked, risks and benefits discussed and monitors and equipment checked  Epidural Patient position: sitting Prep: site prepped and draped and DuraPrep Patient monitoring: continuous pulse ox and blood pressure Approach: midline Injection technique: LOR air  Needle:  Needle type: Tuohy  Needle gauge: 17 G Needle length: 9 cm and 9 Needle insertion depth: 8 cm Catheter type: closed end flexible Catheter size: 19 Gauge Catheter at skin depth: 13 cm Test dose: negative and Other  Assessment Events: blood not aspirated, injection not painful, no injection resistance, negative IV test and no paresthesia  Additional Notes Patient identified. Risks and benefits discussed including failed block, incomplete  Pain control, post dural puncture headache, nerve damage, paralysis, blood pressure Changes, nausea, vomiting, reactions to medications-both toxic and allergic and post Partum back pain. All questions were answered. Patient expressed understanding and wished to proceed. Sterile technique was used throughout procedure. Epidural site was Dressed with sterile barrier dressing. No paresthesias, signs of intravascular injection Or signs of intrathecal spread were encountered.  Patient was more comfortable after the epidural was dosed. Please see RN's note for documentation of vital signs and FHR which are stable.

## 2012-05-12 NOTE — Progress Notes (Signed)
Dr Macon Large and Judie Petit. Mayford Knife, CNM at bedside discussing risks and benefits of C/S and vacuum delivery with patient. Pt states feeling overwhelmed. She has no questions or concerns just wants additional time to make a decision.

## 2012-05-12 NOTE — Progress Notes (Signed)
Sara Gibbs is a 27 y.o. G2P1001 at [redacted]w[redacted]d by ultrasound admitted for induction of labor due to Spontaneous rupture of BOW and Gestational diabetes.  Subjective: Oxygen on. Frustrated over possibility of C/S.  Wants to keep trying for vaginal birth.   Objective: BP 104/62  Pulse 94  Temp 98.9 F (37.2 C) (Axillary)  Resp 18  Ht 5\' 4"  (1.626 m)  Wt 227 lb (102.967 kg)  BMI 38.96 kg/m2  SpO2 96%  LMP 07/31/2011 I/O last 3 completed shifts: In: -  Out: 750 [Urine:750]    FHT:  FHR: 155 bpm, variability: minimal ,  accelerations:  Abscent,  decelerations:  Present Some late, some variable to 80s, some end with UC, some end after UC UC:   regular, every 3-5 minutes SVE:   Dilation: 7.5 Effacement (%): 90 Station: -1 Exam by:: Tressia Danas RN  Labs: Lab Results  Component Value Date   WBC 11.6* 05/11/2012   HGB 10.6* 05/11/2012   HCT 33.2* 05/11/2012   MCV 75.1* 05/11/2012   PLT 200 05/11/2012    Assessment / Plan: Induction of labor due to PROM,  progressing well on pitocin  Labor: Progressing slowly, but Pitocin has been turned off and on due to decels Preeclampsia:   Fetal Wellbeing:  Category II Pain Control:  Epidural I/D:  n/a Anticipated MOD:  Hopefully NSVD but may need C/S  Long discussion with patient and spouse with Dr Macon Large regarding concern over FHR pattern. Discussed FHR is questionable at times and worrisome at times. May need C/S if continues to have concerning decels. Risks and benefits reviewed.  Patient is frustrated and states the baby had decels before and "we could have done a C/S then and I wouldn't have had to go through all this".  Dr Macon Large explained the tracing was not concerning to that degree then and C/S was not recommended then as an elective procedure. But now, the FHR pattern has developed concerning features that may require progression toward Cesarean delivery.   [facility] 05/12/2012, 9:48 AM

## 2012-05-12 NOTE — Progress Notes (Signed)
Subjective: Pt asleep and comfortable   Objective: BP 103/60  Pulse 90  Temp 98.6 F (37 C) (Axillary)  Resp 14  Ht 5\' 4"  (1.626 m)  Wt 102.967 kg (227 lb)  BMI 38.96 kg/m2  SpO2 96%  LMP 07/31/2011      FHT:  FHR: 175 bpm, variability: minimal ,  accelerations:  Abscent,  decelerations:  Present Early UC:   irregular, every 3-5 minutes SVE:   Dilation: 5 Effacement (%): 60 Station: -3 Exam by:: Zorita Pang, CNM  Labs: Lab Results  Component Value Date   WBC 11.6* 05/11/2012   HGB 10.6* 05/11/2012   HCT 33.2* 05/11/2012   MCV 75.1* 05/11/2012   PLT 200 05/11/2012    Assessment / Plan: Spontaneous labor, progressing normally.  Continue monitoring closely, especially considering tachycardia of fetus.  Watch for Fever for chorio.    Labor: Progressing normally, IUPC Preeclampsia:  N/A Fetal Wellbeing:  Category II Pain Control:  Epidural I/D:  n/a Anticipated MOD:  NSVD  Gildardo Cranker 05/12/2012, 3:39 AM

## 2012-05-12 NOTE — Progress Notes (Signed)
Subjective: Pt states she is having occasional ctx.  Denies fever or chills   Objective: BP 107/66  Pulse 85  Temp 98.5 F (36.9 C) (Axillary)  Resp 18  Ht 5\' 4"  (1.626 m)  Wt 102.967 kg (227 lb)  BMI 38.96 kg/m2  SpO2 96%  LMP 07/31/2011      FHT:  FHR: 175 bpm, variability: minimal ,  accelerations:  Abscent,  decelerations:  Present Variable UC:   irregular, every 3-5 minutes SVE:   Dilation: 5 Effacement (%): 60 Station: -3 Exam by:: Zorita Pang, CNM  Labs: Lab Results  Component Value Date   WBC 11.6* 05/11/2012   HGB 10.6* 05/11/2012   HCT 33.2* 05/11/2012   MCV 75.1* 05/11/2012   PLT 200 05/11/2012    Assessment / Plan: Spontaneous labor, progressing normally.  Continue monitoring closely, especially considering tachycardia of fetus.  Watch for Fever for chorio.    Labor: Progressing normally, IUPC, Continue Pitocin infusion  Preeclampsia:  N/A Fetal Wellbeing:  Category II Pain Control:  Epidural I/D:  n/a Anticipated MOD:  NSVD  Hardy Harcum 05/12/2012, 1:05 AM

## 2012-05-12 NOTE — Transfer of Care (Signed)
Immediate Anesthesia Transfer of Care Note  Patient: Sara Gibbs  Procedure(s) Performed: Procedure(s) (LRB) with comments: CESAREAN SECTION (N/A)  Patient Location: PACU  Anesthesia Type:Epidural  Level of Consciousness: awake, alert , oriented and patient cooperative  Airway & Oxygen Therapy: Patient Spontanous Breathing  Post-op Assessment: Report given to PACU RN and Post -op Vital signs reviewed and stable  Post vital signs: Reviewed and stable  Complications: No apparent anesthesia complications

## 2012-05-12 NOTE — Progress Notes (Signed)
Faculty Practice OB/GYN Attending Note  After discussion with the patient and her husband, patient opts for cesarean section given recurrent decelerations and failure of descent.  The risks of cesarean section discussed with the patient included but were not limited to: bleeding which may require transfusion or reoperation; infection which may require antibiotics; injury to bowel, bladder, ureters or other surrounding organs; injury to the fetus; need for additional procedures including hysterectomy in the event of a life-threatening hemorrhage; placental abnormalities wth subsequent pregnancies, incisional problems, thromboembolic phenomenon and other postoperative/anesthesia complications. The patient concurred with the proposed plan, giving informed written consent for the procedure. Preoperative prophylactic antibiotics and SCDs ordered.  To OR when ready.  Jaynie Collins, MD, FACOG Attending Obstetrician & Gynecologist Faculty Practice, [facility]

## 2012-05-12 NOTE — Progress Notes (Signed)
Subjective: Pt states she is having occasional ctx.  Denies fever or chills   Objective: BP 99/63  Pulse 89  Temp 98.5 F (36.9 C) (Axillary)  Resp 18  Ht 5\' 4"  (1.626 m)  Wt 102.967 kg (227 lb)  BMI 38.96 kg/m2  SpO2 96%  LMP 07/31/2011      FHT:  FHR: 175 bpm, variability: minimal ,  accelerations:  Abscent,  decelerations:  Present Variable UC:   irregular, every 3-5 minutes SVE:   Dilation: 5 Effacement (%): 60 Station: -3 Exam by:: Zorita Pang, CNM  Labs: Lab Results  Component Value Date   WBC 11.6* 05/11/2012   HGB 10.6* 05/11/2012   HCT 33.2* 05/11/2012   MCV 75.1* 05/11/2012   PLT 200 05/11/2012    Assessment / Plan: Spontaneous labor, progressing normally.  Continue monitoring closely, especially considering tachycardia of fetus.  Watch for Fever for chorio.    Labor: Progressing normally, IUPC (Adjusted)  Preeclampsia:  N/A Fetal Wellbeing:  Category II Pain Control:  Epidural I/D:  n/a Anticipated MOD:  NSVD  Gildardo Cranker 05/12/2012, 1:55 AM

## 2012-05-12 NOTE — Op Note (Signed)
Sara Gibbs PROCEDURE DATE: 05/10/2012 - 05/12/2012  PREOPERATIVE DIAGNOSIS: Intrauterine pregnancy at  [redacted]w[redacted]d weeks gestation; failed induction, failure to progress: arrest of descent and recurrent fetal heart rate decelerations  POSTOPERATIVE DIAGNOSIS: The same  PROCEDURE: Primary Low Transverse Cesarean Section  SURGEON:  Dr. Jaynie Collins  ASSISTANT:  Dr. Elsie Lincoln  ANESTHESIOLOGIST: Dana Allan, MD  INDICATIONS: Sara Gibbs is a 27 y.o. [id number] at [redacted]w[redacted]d here for cesarean section secondary to the indications listed under preoperative diagnosis; please see preoperative note for further details.  The risks of cesarean section were discussed with the patient including but were not limited to: bleeding which may require transfusion or reoperation; infection which may require antibiotics; injury to bowel, bladder, ureters or other surrounding organs; injury to the fetus; need for additional procedures including hysterectomy in the event of a life-threatening hemorrhage; placental abnormalities wth subsequent pregnancies, incisional problems, thromboembolic phenomenon and other postoperative/anesthesia complications.   The patient concurred with the proposed plan, giving informed written consent for the procedure.    FINDINGS:  Viable female infant in cephalic presentation, body cord x 1.  Apgars 7 and 8.  Clear amniotic fluid.  Intact placenta, three vessel cord.  Normal uterus, fallopian tubes and ovaries bilaterally.  ANESTHESIA: Epidural INTRAVENOUS FLUIDS: 1900 ml ESTIMATED BLOOD LOSS: 1000 ml URINE OUTPUT:  500 ml SPECIMENS: Placenta sent to pathology COMPLICATIONS: None immediate  PROCEDURE IN DETAIL:  The patient preoperatively received intravenous antibiotics and had sequential compression devices applied to her lower extremities.  She was then taken to the operating room where the epidural anesthesia was dosed up to surgical level and was found to be adequate. She  was then placed in a dorsal supine position with a leftward tilt, and prepped and draped in a sterile manner.  A foley catheter was placed into her bladder and attached to constant gravity.  After an adequate timeout was performed, a Pfannenstiel skin incision was made with scalpel and carried through to the underlying layer of fascia. The fascia was incised in the midline, and this incision was extended bilaterally using the Mayo scissors.  Kocher clamps were applied to the superior aspect of the fascial incision and the underlying rectus muscles were dissected off bluntly. A similar process was carried out on the inferior aspect of the fascial incision. The rectus muscles were separated in the midline bluntly and the peritoneum was entered bluntly. Attention was turned to the lower uterine segment where a low transverse hysterotomy was made with a scalpel and extended bilaterally bluntly.  The infant was successfully delivered, the cord was clamped and cut and the infant was handed over to awaiting neonatology team. Uterine massage was then administered, and the placenta delivered intact with a three-vessel cord. The uterus was then cleared of clot and debris.  The hysterotomy was closed with 0 Vicryl in a running locked fashion, and an imbricating layer was also placed with 0 Vicryl.  Of note, there was some bleeding near the right uterine vessels which was controlled by an Antony Salmon stitch. Also there was a mild medial extension of the hysterotomy which was repaired in usual fashion.The pelvis was cleared of all clot and debris. Hemostasis was confirmed on all surfaces.  The peritoneum and the muscles were reapproximated using 0 Vicryl interrupted stitches. The fascia was then closed using 0 PDS in a running fashion.  The subcutaneous layer was irrigated, then reapproximated with 2-0 plain gut interrupted stitches, and the skin was closed with a 4-0 Vicryl  subcuticular stitch. 30 ml of 0.5% Marcaine was injected  subcutaneously around the incision. The patient tolerated the procedure well. Sponge, lap, instrument and needle counts were correct x 2.  She was taken to the recovery room in stable condition.

## 2012-05-13 ENCOUNTER — Encounter: Payer: Self-pay | Admitting: *Deleted

## 2012-05-13 ENCOUNTER — Encounter (HOSPITAL_COMMUNITY): Payer: Self-pay

## 2012-05-13 LAB — CBC
HCT: 27.3 % — ABNORMAL LOW (ref 36.0–46.0)
MCH: 23.5 pg — ABNORMAL LOW (ref 26.0–34.0)
MCV: 75.6 fL — ABNORMAL LOW (ref 78.0–100.0)
Platelets: 225 10*3/uL (ref 150–400)
RDW: 18.2 % — ABNORMAL HIGH (ref 11.5–15.5)
WBC: 16.4 10*3/uL — ABNORMAL HIGH (ref 4.0–10.5)

## 2012-05-13 NOTE — Addendum Note (Signed)
Addendum  created 05/13/12 0836 by Earmon Phoenix, CRNA   Modules edited:Notes Section

## 2012-05-13 NOTE — Anesthesia Postprocedure Evaluation (Signed)
  Anesthesia Post-op Note  Patient: Sara Gibbs  Procedure(s) Performed: Procedure(s) (LRB) with comments: CESAREAN SECTION (N/A)  Patient Location: Mother/Baby  Anesthesia Type:Epidural  Level of Consciousness: awake, alert  and oriented  Airway and Oxygen Therapy: Patient Spontanous Breathing  Post-op Pain: mild  Post-op Assessment: Patient's Cardiovascular Status Stable, Respiratory Function Stable, No headache, No backache, No residual numbness and No residual motor weakness  Post-op Vital Signs: stable  Complications: No apparent anesthesia complications

## 2012-05-13 NOTE — Progress Notes (Signed)
Post Partum Day 1 Subjective: no complaints, up ad lib, voiding, tolerating PO and + flatus  Objective: Blood pressure 96/62, pulse 95, temperature 98 F (36.7 C), temperature source Oral, resp. rate 18, height 5\' 4"  (1.626 m), weight 227 lb (102.967 kg), last menstrual period 07/31/2011, SpO2 99.00%, unknown if currently breastfeeding.  Physical Exam:  General: alert, cooperative and no distress Lochia: appropriate Uterine Fundus: firm Incision: healing well, no significant drainage, no dehiscence, no significant erythema DVT Evaluation: No evidence of DVT seen on physical exam.   Basename 05/13/12 0520 05/11/12 2317  HGB 8.5* 10.6*  HCT 27.3* 33.2*    Assessment/Plan: Contraception undecided Remain inpatient, baby in NICU   LOS: 3 days   Sara Gibbs 05/13/2012, 8:30 AM

## 2012-05-14 NOTE — Progress Notes (Signed)
I have seen and examined this patient and I agree with the above. Sara Gibbs 9:25 AM 05/14/2012

## 2012-05-14 NOTE — Progress Notes (Signed)
I have seen the patient with the student and agree with the above. 

## 2012-05-14 NOTE — Progress Notes (Signed)
Post Partum Day  2 Subjective: no complaints, up ad lib, voiding, tolerating PO and + flatus  Objective: Blood pressure 100/68, pulse 84, temperature 98.4 F (36.9 C), temperature source Oral, resp. rate 18, height 5\' 4"  (1.626 m), weight 227 lb (102.967 kg), last menstrual period 07/31/2011, SpO2 94.00%, unknown if currently breastfeeding.  Physical Exam:  General: alert, cooperative and no distress Lochia: appropriate Uterine Fundus: firm Incision: no significant drainage, no dehiscence, no significant erythema DVT Evaluation: No evidence of DVT seen on physical exam.   Basename 05/13/12 0520 05/11/12 2317  HGB 8.5* 10.6*  HCT 27.3* 33.2*    Assessment/Plan: Plan for discharge tomorrow and Contraception Mirena. Bottle feeding.   LOS: 4 days   Donley Harland 05/14/2012, 7:52 AM

## 2012-05-14 NOTE — Progress Notes (Signed)
UR completed 

## 2012-05-15 ENCOUNTER — Other Ambulatory Visit: Payer: Medicaid Other

## 2012-05-15 MED ORDER — DOCUSATE SODIUM 100 MG PO CAPS: 100.0000 mg | ORAL_CAPSULE | Freq: Two times a day (BID) | ORAL | Status: DC | PRN

## 2012-05-15 MED ORDER — OXYCODONE-ACETAMINOPHEN 5-325 MG PO TABS: 1.0000 | ORAL_TABLET | ORAL | Status: DC | PRN

## 2012-05-15 MED ORDER — IBUPROFEN 600 MG PO TABS: 600.0000 mg | ORAL_TABLET | Freq: Four times a day (QID) | ORAL | Status: AC

## 2012-05-15 NOTE — Discharge Summary (Signed)
Obstetric Discharge Summary  Sara Gibbs is a 27 y.o. [id number] who presented at [redacted]w[redacted]d with PPROM. She received cytotec, a foley bulb and pitocin and progressed to 7.5 cm dilation and was taken for primary cesarean section for arrest of descent. The patient also had Clas A2 Gestational diabetes treated with glyburide. Her blood sugar was well controlled throughout labor and delivery.   Reason for Admission: preterm premature rupture of membranes Prenatal Procedures: none Intrapartum Procedures: cesarean: Primary low cervical, transverse Postpartum Procedures: none Complications-Operative and Postpartum: none  Hemoglobin  Date Value Range Status  05/13/2012 8.5* 12.0 - 15.0 g/dL Final     DELTA CHECK NOTED     REPEATED TO VERIFY  01/10/2012 11.2   Final     HCT  Date Value Range Status  05/13/2012 27.3* 36.0 - 46.0 % Final  01/10/2012 32   Final    Physical Exam:  General: alert, cooperative and no distress Lochia: appropriate Uterine Fundus: firm Incision: no significant drainage, no dehiscence, no significant erythema DVT Evaluation: No evidence of DVT seen on physical exam.  Discharge Diagnoses: PPROM, Preterm delivery, Cesarean delivery, A2 Gestational Diabetes  Discharge Information: Date: 05/15/2012 Activity: pelvic rest Diet: routine Medications: PNV, Ibuprofen, Colace and Percocet Condition: stable Instructions: refer to practice specific booklet Discharge to: home Follow-up Information    Follow up with PRATT,TANYA S, MD. Schedule an appointment as soon as possible for a visit in 6 weeks.   Contact information:   [address] [place] [address] [phone]          Newborn Data: Live born female  Birth Weight: 7 lb 15.6 oz (3616 g) APGAR: 7, 8  Baby remains in NICU.  Sara Gibbs, Sara Gibbs 05/15/2012, 7:39 AM  I saw and examined patient and reviewed hospital course, labs and other records. I agree with the above note. Napoleon Form, MD

## 2012-05-15 NOTE — Clinical Social Work Maternal (Signed)
Clinical Social Work Department PSYCHOSOCIAL ASSESSMENT - MATERNAL/CHILD 05/15/2012  Patient:  Calia,Miroslava A  Account Number:  [id number]  Admit Date:  05/10/2012  Childs Name:   Keyon Fuller    Clinical Social Worker:  Chasty Randal, LCSW   Date/Time:  05/15/2012 01:00 PM  Date Referred:  05/15/2012   Referral source  NICU     Referred reason  NICU   Other referral source:    I:  FAMILY / HOME ENVIRONMENT Child's legal guardian:  PARENT  Guardian - Name Guardian - Age Guardian - Address  Shajuana Friedlander [address] [place], [place] [address]  Gary Fuller Jr.  same   Other household support members/support persons Name Relationship DOB  Sa'Niyah Hilton DAUGHTER 05/31/06  Darius OTHER 4   Other support:   Darius is FOB's son.  He spends alternate weeks with his mother and father.  MOB states FOB's family lives in the area and is supportive.  MOB is from [place] and states her father will be visiting for 3 weeks and her step father plans to stay for a few months to help out.    II  PSYCHOSOCIAL DATA Information Source:  Patient Interview  Financial and Community Resources Employment:   Both parents work at [employer]   Financial resources:  Medicaid If Medicaid - County:  [place]  School / Grade:   Maternity Care Coordinator / Child Services Coordination / Early Interventions:  Cultural issues impacting care:   None identified    III  STRENGTHS Strengths  Adequate Resources  Compliance with medical plan  Home prepared for Child (including basic supplies)  Other - See comment  Supportive family/friends  Understanding of illness   Strength comment:  Pediatric follow up will be with Dr. Kieffer at [facility].   IV  RISK FACTORS AND CURRENT PROBLEMS Current Problem:  None   Risk Factor & Current Problem Patient Issue Family Issue Risk Factor / Current Problem Comment   N N     V  SOCIAL WORK ASSESSMENT CSW met with MOB in her  first floor room/146 to introduce myself, complete assessment and evaluate how she is coping with baby's admission to NICU.  CSW explained support services offered by NICU CSW and gave contact infromation. MOB was very nice and talkative with CSW.  She explained that she and FOB have been together for a long time, but that she just moved here in March of 2013.  She states she is from [place] and had moved back there to be with her mother who was battling cancer.  She states her mother passed on 04/27/12.  She spoke very highly of FOB and his family.  She states they are best friends and have a great relationship.  She seemed tearful when talking about her mother and the baby/NICU situation, but appears to be coping well overall.  She states she has seen glimpses of her mother during this hospitalization and knows she is close by.  MOB repots wanting to go back to work as soon as medically cleared, but that her post partum visit is 4 weeks away.  CSW advised her to call the clinic and see if she could get a sooner appointment.  She states her work is not strenuous.  She states FOB went back to work because they need the income.  She states her step father will be caring for the baby for the first few months after he gets here from [place].  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  She states her homework should be a good time passer during visits while baby is sleeping.  CSW discussed MOB's emotions, the stress of the situation and PPD signs and sypmtoms.  MOB was stating that she is having difficulty coping with the very premature baby across her son's pod.  She states she feels bad that her baby is chunky every time the nurses have to rush to the little baby's bedside.  CSW validated MOB's feelings and cautioned her to expect that these feelings may return months after her son is discharged.  CSW explained benefits of counseling and the ability of CSW to make a referral if  MOB is interested.  She is not at this time, but was very engaged in the conversation and seemed to appreciate CSW's intervention.  CSW asked MOB to call CSW if she has any questions or needs while her son is in the NICU.      VI SOCIAL WORK PLAN Social Work Plan  Psychosocial Support/Ongoing Assessment of Needs   Type of pt/family education:   PPD   If child protective services report - county:   If child protective services report - date:   Information/referral to community resources comment:   No referral needs identified at this time   Other social work plan:  

## 2012-05-17 NOTE — Progress Notes (Signed)
NST on 04/25/12 and 05/08/12 are reactive

## 2012-05-19 NOTE — Discharge Summary (Signed)
Attestation of Attending Supervision of Advanced Practitioner (CNM/NP): Evaluation and management procedures were performed by the Advanced Practitioner under my supervision and collaboration.  I have reviewed the Advanced Practitioner's note and chart, and I agree with the management and plan.  Jaylene Arrowood 05/19/2012 2:25 PM

## 2012-06-04 ENCOUNTER — Telehealth: Payer: Self-pay | Admitting: General Practice

## 2012-06-04 NOTE — Telephone Encounter (Signed)
Patient called and left message stating she wants an update on her short-term disability papers, she was told it would take 7-10 days and tomorrow will make 10 days and she really needs these as soon as possible and would like a call back.

## 2012-06-04 NOTE — Telephone Encounter (Signed)
Disability form completed and faxed to employer. Called patient and left her a voice message informing her that her form was faxed and I would keep a copy for her at the front desk.

## 2012-06-09 ENCOUNTER — Encounter: Payer: Self-pay | Admitting: *Deleted

## 2012-06-18 ENCOUNTER — Encounter: Payer: Self-pay | Admitting: Obstetrics & Gynecology

## 2012-06-18 ENCOUNTER — Ambulatory Visit (INDEPENDENT_AMBULATORY_CARE_PROVIDER_SITE_OTHER): Payer: Medicaid Other | Admitting: Obstetrics & Gynecology

## 2012-06-18 ENCOUNTER — Telehealth: Payer: Self-pay

## 2012-06-18 DIAGNOSIS — T8149XA Infection following a procedure, other surgical site, initial encounter: Secondary | ICD-10-CM

## 2012-06-18 MED ORDER — DICLOXACILLIN SODIUM 500 MG PO CAPS: 500.0000 mg | ORAL_CAPSULE | Freq: Four times a day (QID) | ORAL | Status: DC

## 2012-06-18 NOTE — Telephone Encounter (Signed)
Called pt and spoke to a Augusto Garbe and asked if he could please let her know to please give [place] a call so that we can schedule an appt.  Jillyn Hidden says "I will".

## 2012-06-18 NOTE — Patient Instructions (Addendum)
Levonorgestrel intrauterine device (IUD) What is this medicine? LEVONORGESTREL IUD (LEE voe nor jes trel) is a contraceptive (birth control) device. The device is placed inside the uterus by a healthcare professional. It is used to prevent pregnancy and can also be used to treat heavy bleeding that occurs during your period. Depending on the device, it can be used for 3 to 5 years. This medicine may be used for other purposes; ask your health care provider or pharmacist if you have questions. What should I tell my health care provider before I take this medicine? They need to know if you have any of these conditions: -abnormal Pap smear -cancer of the breast, uterus, or cervix -diabetes -endometritis -genital or pelvic infection now or in the past -have more than one sexual partner or your partner has more than one partner -heart disease -history of an ectopic or tubal pregnancy -immune system problems -IUD in place -liver disease or tumor -problems with blood clots or take blood-thinners -use intravenous drugs -uterus of unusual shape -vaginal bleeding that has not been explained -an unusual or allergic reaction to levonorgestrel, other hormones, silicone, or polyethylene, medicines, foods, dyes, or preservatives -pregnant or trying to get pregnant -breast-feeding How should I use this medicine? This device is placed inside the uterus by a health care professional. Talk to your pediatrician regarding the use of this medicine in children. Special care may be needed. Overdosage: If you think you have taken too much of this medicine contact a poison control center or emergency room at once. NOTE: This medicine is only for you. Do not share this medicine with others. What if I miss a dose? This does not apply. What may interact with this medicine? Do not take this medicine with any of the following medications: -amprenavir -bosentan -fosamprenavir This medicine may also interact with  the following medications: -aprepitant -barbiturate medicines for inducing sleep or treating seizures -bexarotene -griseofulvin -medicines to treat seizures like carbamazepine, ethotoin, felbamate, oxcarbazepine, phenytoin, topiramate -modafinil -pioglitazone -rifabutin -rifampin -rifapentine -some medicines to treat HIV infection like atazanavir, indinavir, lopinavir, nelfinavir, tipranavir, ritonavir -St. John's wort -warfarin This list may not describe all possible interactions. Give your health care provider a list of all the medicines, herbs, non-prescription drugs, or dietary supplements you use. Also tell them if you smoke, drink alcohol, or use illegal drugs. Some items may interact with your medicine. What should I watch for while using this medicine? Visit your doctor or health care professional for regular check ups. See your doctor if you or your partner has sexual contact with others, becomes HIV positive, or gets a sexual transmitted disease. This product does not protect you against HIV infection (AIDS) or other sexually transmitted diseases. You can check the placement of the IUD yourself by reaching up to the top of your vagina with clean fingers to feel the threads. Do not pull on the threads. It is a good habit to check placement after each menstrual period. Call your doctor right away if you feel more of the IUD than just the threads or if you cannot feel the threads at all. The IUD may come out by itself. You may become pregnant if the device comes out. If you notice that the IUD has come out use a backup birth control method like condoms and call your health care provider. Using tampons will not change the position of the IUD and are okay to use during your period. What side effects may I notice from receiving this medicine?   Side effects that you should report to your doctor or health care professional as soon as possible: -allergic reactions like skin rash, itching or  hives, swelling of the face, lips, or tongue -fever, flu-like symptoms -genital sores -high blood pressure -no menstrual period for 6 weeks during use -pain, swelling, warmth in the leg -pelvic pain or tenderness -severe or sudden headache -signs of pregnancy -stomach cramping -sudden shortness of breath -trouble with balance, talking, or walking -unusual vaginal bleeding, discharge -yellowing of the eyes or skin Side effects that usually do not require medical attention (report to your doctor or health care professional if they continue or are bothersome): -acne -breast pain -change in sex drive or performance -changes in weight -cramping, dizziness, or faintness while the device is being inserted -headache -irregular menstrual bleeding within first 3 to 6 months of use -nausea This list may not describe all possible side effects. Call your doctor for medical advice about side effects. You may report side effects to FDA at 1-800-FDA-1088. Where should I keep my medicine? This does not apply. NOTE: This sheet is a summary. It may not cover all possible information. If you have questions about this medicine, talk to your doctor, pharmacist, or health care provider.  2013, [employer]vier/Gold Standard. (05/17/2011 1:54:04 PM) Etonogestrel implant What is this medicine? ETONOGESTREL is a contraceptive (birth control) device. It is used to prevent pregnancy. It can be used for up to 3 years. This medicine may be used for other purposes; ask your health care provider or pharmacist if you have questions. What should I tell my health care provider before I take this medicine? They need to know if you have any of these conditions: -abnormal vaginal bleeding -blood vessel disease or blood clots -cancer of the breast, cervix, or liver -depression -diabetes -gallbladder disease -headaches -heart disease or recent heart attack -high blood pressure -high cholesterol -kidney disease -liver  disease -renal disease -seizures -tobacco smoker -an unusual or allergic reaction to etonogestrel, other hormones, anesthetics or antiseptics, medicines, foods, dyes, or preservatives -pregnant or trying to get pregnant -breast-feeding How should I use this medicine? This device is inserted just under the skin on the inner side of your upper arm by a health care professional. Talk to your pediatrician regarding the use of this medicine in children. Special care may be needed. Overdosage: If you think you've taken too much of this medicine contact a poison control center or emergency room at once. Overdosage: If you think you have taken too much of this medicine contact a poison control center or emergency room at once. NOTE: This medicine is only for you. Do not share this medicine with others. What if I miss a dose? This does not apply. What may interact with this medicine? Do not take this medicine with any of the following medications: -amprenavir -bosentan -fosamprenavir This medicine may also interact with the following medications: -barbiturate medicines for inducing sleep or treating seizures -certain medicines for fungal infections like ketoconazole and itraconazole -griseofulvin -medicines to treat seizures like carbamazepine, felbamate, oxcarbazepine, phenytoin, topiramate -modafinil -phenylbutazone -rifampin -some medicines to treat HIV infection like atazanavir, indinavir, lopinavir, nelfinavir, tipranavir, ritonavir -St. John's wort This list may not describe all possible interactions. Give your health care provider a list of all the medicines, herbs, non-prescription drugs, or dietary supplements you use. Also tell them if you smoke, drink alcohol, or use illegal drugs. Some items may interact with your medicine. What should I watch for while using this medicine? This product  does not protect you against HIV infection (AIDS) or other sexually transmitted diseases. You  should be able to feel the implant by pressing your fingertips over the skin where it was inserted. Tell your doctor if you cannot feel the implant. What side effects may I notice from receiving this medicine? Side effects that you should report to your doctor or health care professional as soon as possible: -allergic reactions like skin rash, itching or hives, swelling of the face, lips, or tongue -breast lumps -changes in vision -confusion, trouble speaking or understanding -dark urine -depressed mood -general ill feeling or flu-like symptoms -light-colored stools -loss of appetite, nausea -right upper belly pain -severe headaches -severe pain, swelling, or tenderness in the abdomen -shortness of breath, chest pain, swelling in a leg -signs of pregnancy -sudden numbness or weakness of the face, arm or leg -trouble walking, dizziness, loss of balance or coordination -unusual vaginal bleeding, discharge -unusually weak or tired -yellowing of the eyes or skin Side effects that usually do not require medical attention (Report these to your doctor or health care professional if they continue or are bothersome.): -acne -breast pain -changes in weight -cough -fever or chills -headache -irregular menstrual bleeding -itching, burning, and vaginal discharge -pain or difficulty passing urine -sore throat This list may not describe all possible side effects. Call your doctor for medical advice about side effects. You may report side effects to FDA at 1-800-FDA-1088. Where should I keep my medicine? This drug is given in a hospital or clinic and will not be stored at home. NOTE: This sheet is a summary. It may not cover all possible information. If you have questions about this medicine, talk to your doctor, pharmacist, or health care provider.  2012, Elsevier/Gold Standard. (01/07/2009 3:54:17 PM)Wound Infection A wound infection happens when a type of germ (bacteria) starts growing in  the wound. In some cases, this can cause the wound to break open. If cared for properly, the infected wound will heal from the inside to the outside. Wound infections need treatment. CAUSES An infection is caused by bacteria growing in the wound.  SYMPTOMS   Increase in redness, swelling, or pain at the wound site.  Increase in drainage at the wound site.  Wound or bandage (dressing) starts to smell bad.  Fever.  Feeling tired or fatigued.  Pus draining from the wound. TREATMENT  You caregiver will prescribe antibiotic medicine. The wound infection should improve within 24 to 48 hours. Any redness around the wound should stop spreading and the wound should be less painful.  HOME CARE INSTRUCTIONS   Only take over-the-counter or prescription medicines for pain, discomfort, or fever as directed by your caregiver.  Take your antibiotics as directed. Finish them even if you start to feel better.  Gently wash the area with mild soap and water 2 times a day, or as directed. Rinse off the soap. Pat the area dry with a clean towel. Do not rub the wound. This may cause bleeding.  Follow your caregiver's instructions for how often you need to change the dressing.  Apply ointment and a dressing to the wound as directed.  If the dressing sticks, moisten it with soapy water and gently remove it.  Change the bandage right away if it becomes wet, dirty, or develops a bad smell.  Take showers. Do not take tub baths, swim, or do anything that may soak the wound until it is healed.  Avoid exercises that make you sweat heavily.  Use anti-itch  medicine as directed by your caregiver. The wound may itch when it is healing. Do not pick or scratch at the wound.  Follow up with your caregiver to get your wound rechecked as directed. SEEK MEDICAL CARE IF:  You have an increase in swelling, pain, or redness around the wound.  You have an increase in the amount of pus coming from the wound.  There  is a bad smell coming from the wound.  More of the wound breaks open.  You have a fever. MAKE SURE YOU:   Understand these instructions.  Will watch your condition.  Will get help right away if you are not doing well or get worse. Document Released: 01/13/2003 Document Revised: 07/09/2011 Document Reviewed: 08/20/2010 [facility] Patient Information 2013 [place], [place].

## 2012-06-18 NOTE — Telephone Encounter (Signed)
Message copied by Faythe Casa on Wed Jun 18, 2012  2:06 PM ------      Message from: Willodean Rosenthal      Created: Wed Jun 18, 2012  1:59 PM       Please call pt.  She needs a 2 hr gtt at her next visit.            Thx,      clh-S  ------

## 2012-06-18 NOTE — Progress Notes (Unsigned)
Patient ID: [name], female   DOB: 1986/01/14, 27 y.o.   MRN: [id number] Subjective:     Sara Gibbs is a 27 y.o. female who presents for a postpartum visit. She is 4 weeks postpartum following a low cervical transverse Cesarean section. I have fully reviewed the prenatal and intrapartum course. The delivery was at 34 gestational weeks. Outcome: primary cesarean section, low transverse incision. Anesthesia: epidural. Postpartum course has been complicated by pain in incision. Baby's course has been complicated by a 1 week NICU visit. Baby is feeding by breast. Bleeding no bleeding. Bowel function is normal. Bladder function is normal. Patient is not sexually active. Contraception method is none. Postpartum depression screening: negative.  The following portions of the patient's history were reviewed and updated as appropriate: allergies, current medications, past family history, past medical history, past social history, past surgical history and problem list.  Review of Systems pt c/o pain in her wound.  Unable to place anything on or near the incision   Objective:    BP 126/88  Pulse 88  Temp(Src) 97.8 F (36.6 C) (Oral)  Ht 5\' 4"  (1.626 m)  Wt 210 lb 14.4 oz (95.664 kg)  BMI 36.18 kg/m2  Breastfeeding? No  General:  alert and mild distress           Abdomen: soft, non-tender; bowel sounds normal; no masses,  no organomegaly incision very tender and erythematous no drainage                          Assessment:     4 weeks postpartum exam. Pap smear not done at today's visit.   Plan:    1. Contraception: pt interested in Nexplanon vs Mirena.  d/w pt the risk of  failure with Nexplanon given her weight.  Pt will read the info and f/u for one or the other 2. Wound infection 3. Follow up in: 1 week or as needed.  4. Dicloxacillin 500mg  qid x 10 days 5. Needs a 2 hr GTT

## 2012-06-23 ENCOUNTER — Other Ambulatory Visit: Payer: Self-pay | Admitting: Obstetrics & Gynecology

## 2012-06-23 ENCOUNTER — Ambulatory Visit (INDEPENDENT_AMBULATORY_CARE_PROVIDER_SITE_OTHER): Payer: Medicaid Other | Admitting: Obstetrics & Gynecology

## 2012-06-23 ENCOUNTER — Encounter: Payer: Self-pay | Admitting: Obstetrics & Gynecology

## 2012-06-23 VITALS — BP 129/90 | HR 99 | Temp 96.3°F | Ht 64.0 in | Wt 210.0 lb

## 2012-06-23 DIAGNOSIS — Z01812 Encounter for preprocedural laboratory examination: Secondary | ICD-10-CM

## 2012-06-23 DIAGNOSIS — B373 Candidiasis of vulva and vagina: Secondary | ICD-10-CM

## 2012-06-23 DIAGNOSIS — Z975 Presence of (intrauterine) contraceptive device: Secondary | ICD-10-CM

## 2012-06-23 DIAGNOSIS — Z30430 Encounter for insertion of intrauterine contraceptive device: Secondary | ICD-10-CM

## 2012-06-23 LAB — POCT PREGNANCY, URINE: Preg Test, Ur: NEGATIVE

## 2012-06-23 MED ORDER — FLUCONAZOLE 150 MG PO TABS: 150.0000 mg | ORAL_TABLET | Freq: Once | ORAL | Status: DC

## 2012-06-23 MED ORDER — LEVONORGESTREL 20 MCG/24HR IU IUD
INTRAUTERINE_SYSTEM | Freq: Once | INTRAUTERINE | Status: AC
  Administered 2012-06-23: 1 via INTRAUTERINE

## 2012-06-23 MED ORDER — LEVONORGESTREL 20 MCG/24HR IU IUD: 1.0000 | INTRAUTERINE_SYSTEM | Freq: Once | INTRAUTERINE | Status: DC

## 2012-06-23 NOTE — Patient Instructions (Signed)

## 2012-06-23 NOTE — Progress Notes (Signed)
Patient ID: Sara Gibbs, female   DOB: Jul 11, 1985, 27 y.o.   MRN: [id number] Patient identified, informed consent performed.  Discussed risks of irregular bleeding, cramping, infection, malpositioning or misplacement of the IUD outside the uterus which may require further procedures. Time out was performed.  Urine pregnancy test negative.  Yeast noted on vulva and in vagina. Speculum placed in the vagina.  Cervix visualized.  Cleaned with Betadine x 2. Hurricaine spray applied to ant lip of cervix.  Cervix grasped anteriorly with a single tooth tenaculum.  Uterus sounded to 9 cm.  Mirena IUD placed per manufacturer's recommendations.  Strings trimmed to 3 cm. Tenaculum was removed, good hemostasis noted.  Patient tolerated procedure well.   Abd examined.  Wound improved from last visit.  lateral left edge still slightly open.  No erythema.  No drainage.  Much less tenderness than last visit.    Patient was given post-procedure instructions and the Mirena care card with expiration date.  Patient was also asked to check IUD strings periodically and follow up in 4-6 weeks for IUD check.

## 2012-06-23 NOTE — Addendum Note (Signed)
Addended by: Toula Moos on: 06/23/2012 03:41 PM   Modules accepted: Orders, Medications

## 2012-06-23 NOTE — Telephone Encounter (Signed)
Called patient, no answer- left message to call us back at the clinics 

## 2012-06-24 NOTE — Telephone Encounter (Signed)
Called pt and informed her of need for 2hr GTT. Pt was given appt on 06/27/12 @ 0830. She agreed and voiced understanding of plan of care and appt.

## 2012-06-27 ENCOUNTER — Other Ambulatory Visit: Payer: Medicaid Other

## 2012-06-30 ENCOUNTER — Other Ambulatory Visit: Payer: Medicaid Other

## 2012-06-30 DIAGNOSIS — O99814 Abnormal glucose complicating childbirth: Secondary | ICD-10-CM

## 2012-07-01 ENCOUNTER — Telehealth: Payer: Self-pay | Admitting: *Deleted

## 2012-07-01 LAB — GLUCOSE TOLERANCE, 2 HOURS W/ 1HR: Glucose, 1 hour: 175 mg/dL — ABNORMAL HIGH (ref 70–170)

## 2012-07-01 NOTE — Telephone Encounter (Signed)
Message copied by Jill Side on Tue Jul 01, 2012  9:23 AM ------      Message from: Willodean Rosenthal      Created: Mon Jun 30, 2012  3:38 PM       Pt was supposed to come for 2 hr and did not get her glucola.  Please call.            Thx,      clh-S ------

## 2012-07-01 NOTE — Telephone Encounter (Signed)
Erroneous encounter

## 2012-07-02 ENCOUNTER — Encounter: Payer: Self-pay | Admitting: *Deleted

## 2012-07-02 ENCOUNTER — Telehealth: Payer: Self-pay | Admitting: *Deleted

## 2012-07-02 NOTE — Telephone Encounter (Signed)
Lia called back and left a message that she is returning Diane's call about letter to return to work. States she will pick it up , just call and let her know when to pick it up. Called Kassady and informed her letter is ready can pick it up now until 12 and then 1245 until 4- patient states will pick it up in 10 minutes. Letter printed and given to front desk

## 2012-07-02 NOTE — Telephone Encounter (Signed)
Pt left message requesting letter so that she could return to work on 07/07/12 instead of 07/08/12. I called back and left message on her personal voice mail that we can provide that letter. I requested her to call back if she would like it to be faxed to her job or she can come to the clinic to pick it up. Letter was prepared and is ready to be printed for pt. Also, I stated that her 2hr GTT was abnormal and she will need to follow up with her PCP for evaluation and treatment of possible Type II diabetes.

## 2012-07-21 ENCOUNTER — Ambulatory Visit: Payer: Medicaid Other | Admitting: Obstetrics and Gynecology

## 2012-07-24 ENCOUNTER — Ambulatory Visit (INDEPENDENT_AMBULATORY_CARE_PROVIDER_SITE_OTHER): Payer: Medicaid Other | Admitting: Medical

## 2012-07-24 ENCOUNTER — Telehealth: Payer: Self-pay | Admitting: *Deleted

## 2012-07-24 ENCOUNTER — Encounter: Payer: Self-pay | Admitting: Obstetrics & Gynecology

## 2012-07-24 VITALS — BP 134/94 | HR 105 | Temp 99.4°F | Ht 64.0 in | Wt 214.3 lb

## 2012-07-24 DIAGNOSIS — I10 Essential (primary) hypertension: Secondary | ICD-10-CM

## 2012-07-24 DIAGNOSIS — Z30431 Encounter for routine checking of intrauterine contraceptive device: Secondary | ICD-10-CM

## 2012-07-24 DIAGNOSIS — R7309 Other abnormal glucose: Secondary | ICD-10-CM

## 2012-07-24 DIAGNOSIS — T148XXD Other injury of unspecified body region, subsequent encounter: Secondary | ICD-10-CM

## 2012-07-24 MED ORDER — HYDROCHLOROTHIAZIDE 25 MG PO TABS: 25.0000 mg | ORAL_TABLET | Freq: Every day | ORAL | Status: DC

## 2012-07-24 MED ORDER — BACITRACIN 500 UNIT/GM EX OINT: 1.0000 "application " | TOPICAL_OINTMENT | Freq: Two times a day (BID) | CUTANEOUS | Status: AC

## 2012-07-24 MED ORDER — METFORMIN HCL 500 MG PO TABS: 500.0000 mg | ORAL_TABLET | Freq: Two times a day (BID) | ORAL | Status: DC

## 2012-07-24 NOTE — Telephone Encounter (Signed)
Need to send [facility] referral- patient aware they will call her with appt.

## 2012-07-24 NOTE — Telephone Encounter (Signed)
Referral faxed

## 2012-07-24 NOTE — Patient Instructions (Signed)
Diabetes and Exercise Regular exercise is important and can help:   Control blood glucose (sugar).  Decrease blood pressure.    Control blood lipids (cholesterol, triglycerides).  Improve overall health. BENEFITS FROM EXERCISE  Improved fitness.  Improved flexibility.  Improved endurance.  Increased bone density.  Weight control.  Increased muscle strength.  Decreased body fat.  Improvement of the body's use of insulin, a hormone.  Increased insulin sensitivity.  Reduction of insulin needs.  Reduced stress and tension.  Helps you feel better. People with diabetes who add exercise to their lifestyle gain additional benefits, including:  Weight loss.  Reduced appetite.  Improvement of the body's use of blood glucose.  Decreased risk factors for heart disease:  Lowering of cholesterol and triglycerides.  Raising the level of good cholesterol (high-density lipoproteins, HDL).  Lowering blood sugar.  Decreased blood pressure. TYPE 1 DIABETES AND EXERCISE  Exercise will usually lower your blood glucose.  If blood glucose is greater than 240 mg/dl, check urine ketones. If ketones are present, do not exercise.  Location of the insulin injection sites may need to be adjusted with exercise. Avoid injecting insulin into areas of the body that will be exercised. For example, avoid injecting insulin into:  The arms when playing tennis.  The legs when jogging. For more information, discuss this with your caregiver.  Keep a record of:  Food intake.  Type and amount of exercise.  Expected peak times of insulin action.  Blood glucose levels. Do this before, during, and after exercise. Review your records with your caregiver. This will help you to develop guidelines for adjusting food intake and insulin amounts.  TYPE 2 DIABETES AND EXERCISE  Regular physical activity can help control blood glucose.  Exercise is important because it may:  Increase the  body's sensitivity to insulin.  Improve blood glucose control.  Exercise reduces the risk of heart disease. It decreases serum cholesterol and triglycerides. It also lowers blood pressure.  Those who take insulin or oral hypoglycemic agents should watch for signs of hypoglycemia. These signs include dizziness, shaking, sweating, chills, and confusion.  Body water is lost during exercise. It must be replaced. This will help to avoid loss of body fluids (dehydration) or heat stroke. Be sure to talk to your caregiver before starting an exercise program to make sure it is safe for you. Remember, any activity is better than none.  Document Released: 07/07/2003 Document Revised: 07/09/2011 Document Reviewed: 10/21/2008 [facility] Patient Information 2013 [place], [place]. Hypertension Hypertension is another name for high blood pressure. High blood pressure may mean that your heart needs to work harder to pump blood. Blood pressure consists of two numbers, which includes a higher number over a lower number (example: 110/72). HOME CARE   Make lifestyle changes as told by your doctor. This may include weight loss and exercise.  Take your blood pressure medicine every day.  Limit how much salt you use.  Stop smoking if you smoke.  Do not use drugs.  Talk to your doctor if you are using decongestants or birth control pills. These medicines might make blood pressure higher.  Females should not drink more than 1 alcoholic drink per day. Males should not drink more than 2 alcoholic drinks per day.  See your doctor as told. GET HELP RIGHT AWAY IF:   You have a blood pressure reading with a top number of 180 or higher.  You get a very bad headache.  You get blurred or changing vision.  You  feel confused.  You feel weak, numb, or faint.  You get chest or belly (abdominal) pain.  You throw up (vomit).  You cannot breathe very well. MAKE SURE YOU:   Understand these instructions.  Will  watch your condition.  Will get help right away if you are not doing well or get worse. Document Released: 10/03/2007 Document Revised: 07/09/2011 Document Reviewed: 10/03/2007 [facility] Patient Information 2013 [place], [place].

## 2012-07-24 NOTE — Progress Notes (Signed)
Patient ID: Sara Gibbs, female   DOB: 07/25/1985, 27 y.o.   MRN: [id number]  History:  Sara Gibbs  is a 27 y.o. [id number] who presents to clinic today for follow-up for IUD string check and wound check. The patient states that she has been having bleeding daily since the insertion of the IUD. She has had occasional cramps that are well-controlled with Motrin. Her scar from her C/S is still "open" a bit. She has been using a washcloth to clean the area but stopped wearing a pad over the area because she felt it was sticking to her skin. She patient is also concerned that she has a "boil" on her mons pubis. It is mildly tender and has not drained any fluid. She had one on her leg during pregnancy and it was drained. She also feels that she is "retaining water." She has started exercising again by walking on the treadmill. She states that she was told she would need to follow-up for an abnormal 2 hr GTT.   The following portions of the patient's history were reviewed and updated as appropriate: allergies, current medications, past family history, past medical history, past social history, past surgical history and problem list.  Review of Systems:  Pertinent items are noted in HPI.  Objective:  Physical Exam BP 134/94  Pulse 105  Temp(Src) 99.4 F (37.4 C) (Oral)  Ht 5\' 4"  (1.626 m)  Wt 214 lb 4.8 oz (97.206 kg)  BMI 36.77 kg/m2  Breastfeeding? No GENERAL: Well-developed, well-nourished female in no acute distress.  HEENT: Normocephalic, atraumatic.  LUNGS: Normal effort. HEART: Regular rate. ABDOMEN: Soft, nontender. 5 cm on left side of c/s scar not completely healed. No surrounding edema or erythema.  PELVIC: Normal external female genitalia. Vagina is pink and rugated.  Small amount of bleeding. Normal cervix contour. IUD strings are visible and appropriate length.  EXTREMITIES: No cyanosis, clubbing, or edema.  Consulted with Dr. Erin Fulling as she has seen the C/S  scar previously. She gave recommendations for bacitracin, HCTZ and Metformin. Would like patient to follow-up with her in 1 month and have PCP referral.   Assessment & Plan:  Assessment: 1. Delayed wound healing of C/S scar 2. Hypertension 3. Abnormal glucose tolerance 4. Abscess of the mons pubis  Plans: Rx for Bacitracin ointment sent to patient's pharmacy. Patient given gauze to use over affect area to maintain dryness Rx for HCTZ 25 mg daily sent to patient's pharmacy Rx for Metformin 500 mg BID sent to patient's pharmacy. Patient instructed to start by taking 500 mg daily x 2 weeks and then advance in order to avoid GI side effects.  Referral to PCP from [facility] clinic today for HTN and DM Patient encouraged to use hot compresses on abscess ~ 15 minutes BID until drainage occurs Patient will follow-up with Dr. Erin Fulling in 1 month

## 2012-07-31 ENCOUNTER — Encounter: Payer: Self-pay | Admitting: Family Medicine

## 2012-07-31 ENCOUNTER — Ambulatory Visit (INDEPENDENT_AMBULATORY_CARE_PROVIDER_SITE_OTHER): Payer: Medicaid Other | Admitting: Family Medicine

## 2012-07-31 VITALS — BP 124/87 | HR 79 | Temp 98.2°F | Ht 65.0 in | Wt 210.3 lb

## 2012-07-31 DIAGNOSIS — O900 Disruption of cesarean delivery wound: Secondary | ICD-10-CM | POA: Insufficient documentation

## 2012-07-31 DIAGNOSIS — I10 Essential (primary) hypertension: Secondary | ICD-10-CM | POA: Insufficient documentation

## 2012-07-31 DIAGNOSIS — O24419 Gestational diabetes mellitus in pregnancy, unspecified control: Secondary | ICD-10-CM

## 2012-07-31 DIAGNOSIS — E119 Type 2 diabetes mellitus without complications: Secondary | ICD-10-CM

## 2012-07-31 DIAGNOSIS — O99810 Abnormal glucose complicating pregnancy: Secondary | ICD-10-CM

## 2012-07-31 NOTE — Assessment & Plan Note (Addendum)
Newly diagnosed on Metformin 500mg  BID A1C checked today looked good. Continue diet and exercise. RTC in 2-4 wks for  FLP,BMP and Urine microalbuminuria.

## 2012-07-31 NOTE — Assessment & Plan Note (Signed)
Pressure dressing applied to wound. Wound does not look infected. F/U with OB as instructed. Keep wound clean and dry. RTC soon if worsening.

## 2012-07-31 NOTE — Progress Notes (Signed)
Subjective:     Patient ID: [name], female   DOB: Nov 16, 1985, 27 y.o.   MRN: [id number]  HPI HTN/DM: Patient was recently diagnosed with HTN for which she was started on HCTZ last week by her OB,she also had gestational DM and after delivery glucose remained high,hence was started on Metformin 1 wk ago by her OB.Patient denies any urinary symptoms,no headache,she see nutritionist regularly,she started going to the gym for weight loss.She will like to get off med. Wound dehiscence: She had C/S 05/12/12,after complete healing of her wound she noticed oozing and bleeding on the scar,she was seen by her OB who prescribed Bacitracin ointment,she denies fever,no pain,she has f/u with her OB in 4 wks.  Past Medical History  Diagnosis Date  . GERD (gastroesophageal reflux disease)   . Ovarian cyst   . Migraines   . Seasonal allergies   . UTI (urinary tract infection) during pregnancy   . Chlamydia   . Gestational diabetes       Review of Systems  Respiratory: Negative.   Cardiovascular: Negative.   Gastrointestinal: Negative.   Genitourinary: Negative.   Skin: Positive for wound.  Neurological: Negative.   All other systems reviewed and are negative.   Filed Vitals:   07/31/12 1431  BP: 124/87  Pulse: 79  Temp: 98.2 F (36.8 C)  TempSrc: Oral  Height: 5\' 5"  (1.651 m)  Weight: 210 lb 4.8 oz (95.391 kg)       Objective:   Physical Exam  Nursing note and vitals reviewed. Constitutional: She is oriented to person, place, and time. She appears well-developed. No distress.  Cardiovascular: Normal rate, regular rhythm, normal heart sounds and intact distal pulses.   No murmur heard. Pulmonary/Chest: Effort normal and breath sounds normal.  Abdominal: Soft. Bowel sounds are normal. She exhibits no distension and no mass. There is no tenderness.  Neurological: She is alert and oriented to person, place, and time.  Skin: Skin is warm.                Assessment/Plan:

## 2012-07-31 NOTE — Patient Instructions (Addendum)

## 2012-07-31 NOTE — Assessment & Plan Note (Signed)
Newly diagnosed. BP looks good on HCTZ 25 mg qd. Continue same med,I recommended daily BP check. RTC in 4 wks for reassessment. May consider tapering dose if BP consistently look good.

## 2012-08-25 ENCOUNTER — Ambulatory Visit: Payer: Medicaid Other | Admitting: Obstetrics & Gynecology

## 2012-08-28 ENCOUNTER — Ambulatory Visit: Payer: Medicaid Other | Admitting: Family Medicine

## 2012-09-09 ENCOUNTER — Encounter: Payer: Self-pay | Admitting: Family Medicine

## 2012-09-09 ENCOUNTER — Ambulatory Visit (INDEPENDENT_AMBULATORY_CARE_PROVIDER_SITE_OTHER): Payer: Medicaid Other | Admitting: Family Medicine

## 2012-09-09 VITALS — BP 104/60 | HR 105 | Temp 99.9°F | Ht 64.0 in | Wt 213.0 lb

## 2012-09-09 DIAGNOSIS — O900 Disruption of cesarean delivery wound: Secondary | ICD-10-CM

## 2012-09-09 DIAGNOSIS — E119 Type 2 diabetes mellitus without complications: Secondary | ICD-10-CM

## 2012-09-09 DIAGNOSIS — I10 Essential (primary) hypertension: Secondary | ICD-10-CM

## 2012-09-09 DIAGNOSIS — E669 Obesity, unspecified: Secondary | ICD-10-CM

## 2012-09-09 MED ORDER — HYDROCHLOROTHIAZIDE 12.5 MG PO TABS: 12.5000 mg | ORAL_TABLET | Freq: Every day | ORAL | Status: AC

## 2012-09-09 NOTE — Assessment & Plan Note (Signed)
  Wound dehiscence Wound closing well,continue to keep region dry and bacitracin prn.

## 2012-09-09 NOTE — Progress Notes (Signed)
Subjective:     Patient ID: Sara Gibbs, female   DOB: Apr 16, 1986, 27 y.o.   MRN: [id number]  HPI AOZ:[id number] with HCTZ 25 mg qd,home BP has been very good. DM:She started taking Metformin 500 mg qd instead of daily due to GI upset,denies any other concern. Obesity:Patient has been going to the gym once a week,she now does zumba dance,she is on DM diet. Wound dehiscence; Wound healing well. Here for follow up.  Current Outpatient Prescriptions on File Prior to Visit  Medication Sig Dispense Refill  . bacitracin 500 UNIT/GM ointment Apply 1 application topically 2 (two) times daily.  15 g  0  . flintstones complete (FLINTSTONES) 60 MG chewable tablet Chew 2 tablets by mouth every morning.      Marland Kitchen ibuprofen (ADVIL,MOTRIN) 600 MG tablet Take 1 tablet (600 mg total) by mouth every 6 (six) hours.  30 tablet  1   No current facility-administered medications on file prior to visit.    Past Medical History  Diagnosis Date  . GERD (gastroesophageal reflux disease)   . Ovarian cyst   . Migraines   . Seasonal allergies   . UTI (urinary tract infection) during pregnancy   . Chlamydia   . Gestational diabetes      Review of Systems  Constitutional: Negative.   Respiratory: Negative.   Cardiovascular: Negative.   Gastrointestinal: Negative.   Genitourinary: Negative.   Neurological: Negative.   All other systems reviewed and are negative.    Filed Vitals:   09/09/12 1021  BP: 104/60  Pulse: 105  Temp: 99.9 F (37.7 C)  TempSrc: Oral  Height: 5\' 4"  (1.626 m)  Weight: 213 lb (96.616 kg)       Objective:   Physical Exam  Nursing note and vitals reviewed. Constitutional: She is oriented to person, place, and time. She appears well-developed. No distress.  Cardiovascular: Normal rate, regular rhythm, normal heart sounds and intact distal pulses.   No murmur heard. Pulmonary/Chest: Effort normal and breath sounds normal. No respiratory distress. She has no wheezes.    Abdominal: Soft. Bowel sounds are normal. She exhibits no distension and no mass. There is no tenderness.    Musculoskeletal: Normal range of motion. She exhibits no edema.       Feet:  Neurological: She is alert and oriented to person, place, and time. No cranial nerve deficit.       Assessment:     HTN: DM: Obesity: Wound dehiscence    Plan:     1. BP very good,might cut back on HCTZ to 12.5 mg qd,f/u in 2 wks for reassessment,continue home BP check.  2. Continue Metformin 500 mg qd. I recommended FLP and BMP check but she is not fasting today,she prefers to get this checked at next visit when she will be checking her A1C again. Retinal scan done today.  3. Counseling done on diet and exercise,I also gave handout on weight loss plan.  4.  Wound closing well,continue to keep region dry and bacitracin prn.

## 2012-09-09 NOTE — Assessment & Plan Note (Signed)
HTN: BP very good,might cut back on HCTZ to 12.5 mg qd,f/u in 2 wks for reassessment,continue home BP check.

## 2012-09-09 NOTE — Assessment & Plan Note (Signed)
:   Obesity: Counseling done on diet and exercise,I also gave handout on weight loss plan.

## 2012-09-09 NOTE — Assessment & Plan Note (Signed)
  DM: . Continue Metformin 500 mg qd. I recommended FLP and BMP check but she is not fasting today,she prefers to get this checked at next visit when she will be checking her A1C again. Retinal scan done today.

## 2012-09-09 NOTE — Patient Instructions (Signed)

## 2013-09-02 ENCOUNTER — Telehealth: Payer: Self-pay | Admitting: *Deleted

## 2013-09-02 NOTE — Telephone Encounter (Signed)
Left message on voicemail for patient to call back and set up appointment for follow up of diabetes and cholesterol.

## 2014-03-01 ENCOUNTER — Encounter: Payer: Self-pay | Admitting: Family Medicine

## 2014-07-13 ENCOUNTER — Telehealth: Payer: Self-pay | Admitting: *Deleted

## 2014-07-13 NOTE — Telephone Encounter (Signed)
Left message on voicemail for patient to call back and schedule an appointment for DM management.

## 2014-09-21 IMAGING — US US RENAL
1 series · 14 of 25 positions shown · non-contrast
Comparison: None.

CLINICAL DATA: 20-week intrauterine pregnancy with abdominal pain.

RENAL/URINARY TRACT ULTRASOUND COMPLETE

[Series 1: us renal · 14 of 59 slices shown]
[im 1/59]
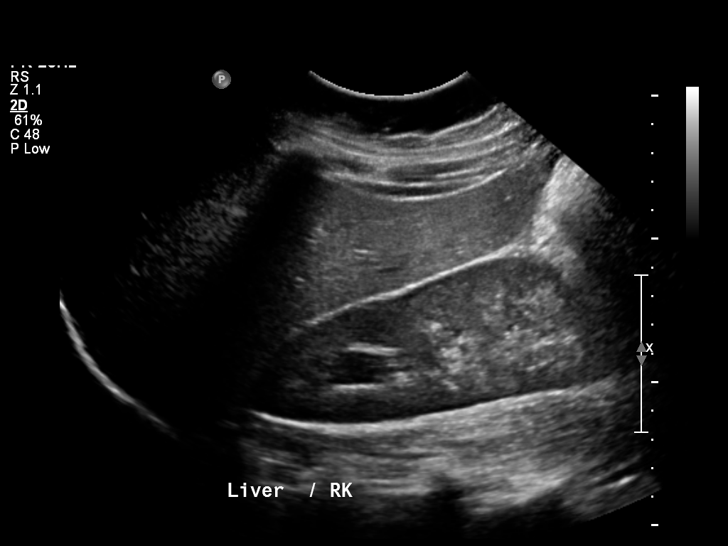
[im 5/59]
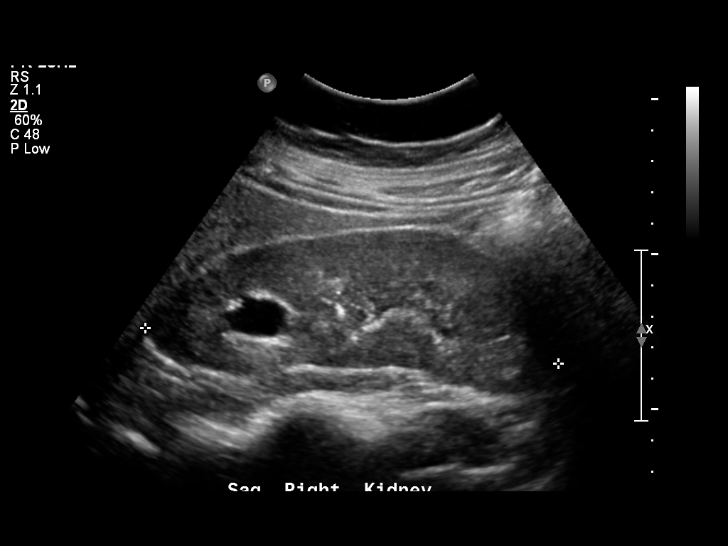
[im 10/59]
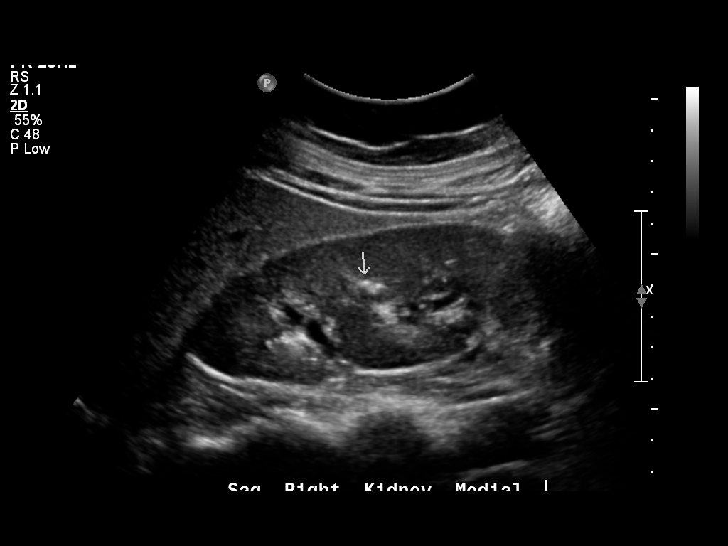
[im 15/59]
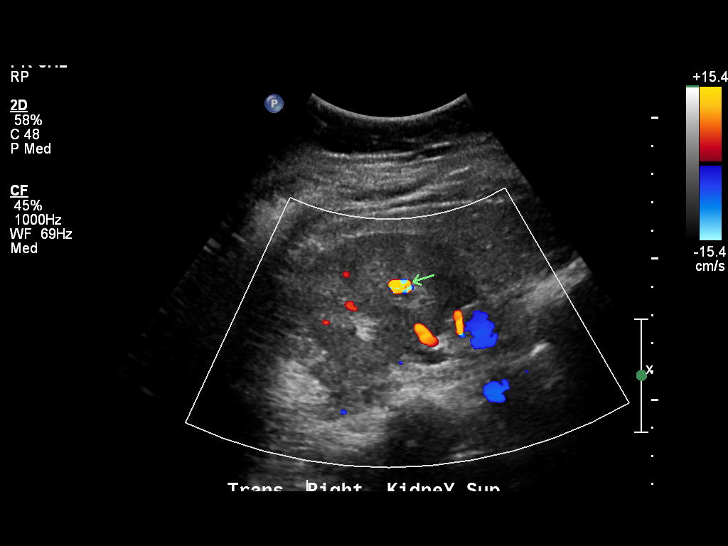
[im 20/59]
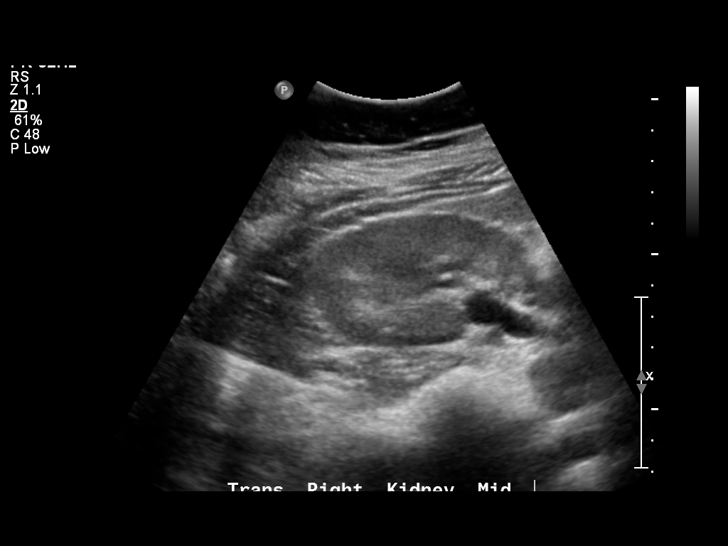
[im 22/59]
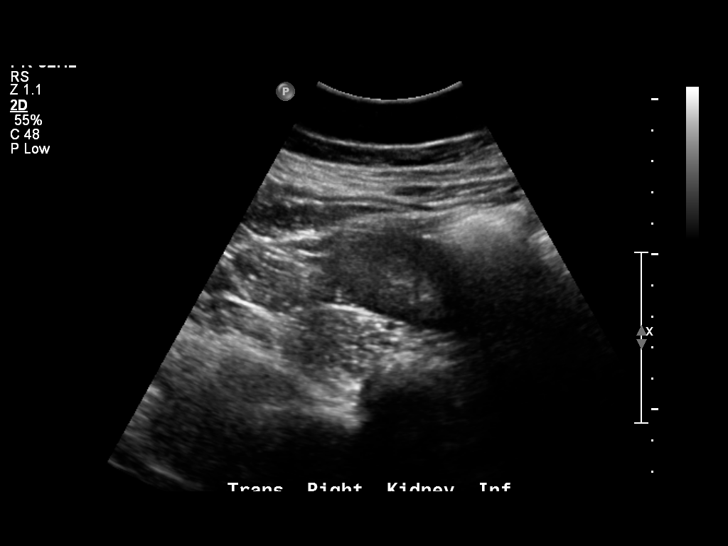
[im 27/59]
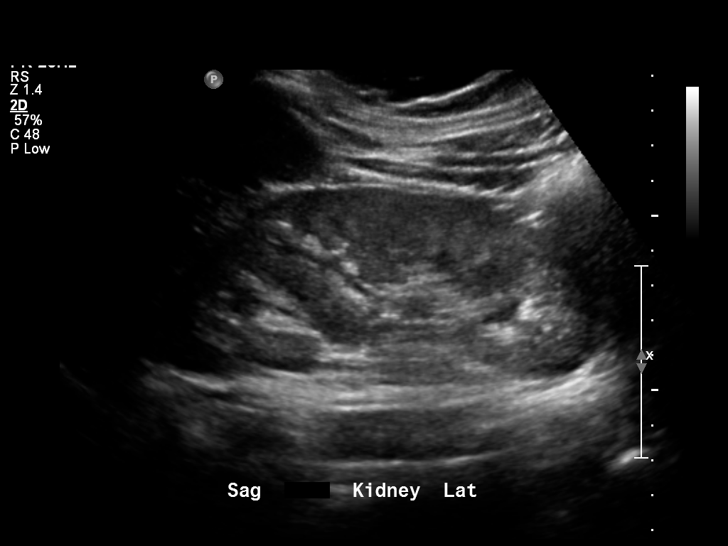
[im 32/59]
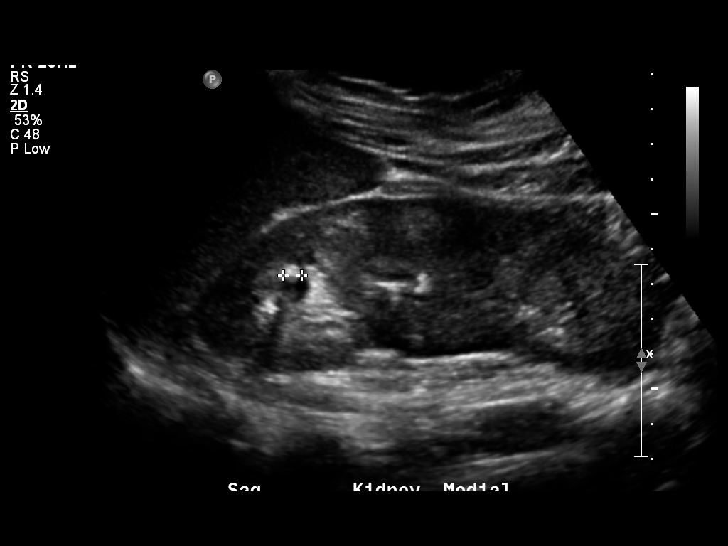
[im 37/59]
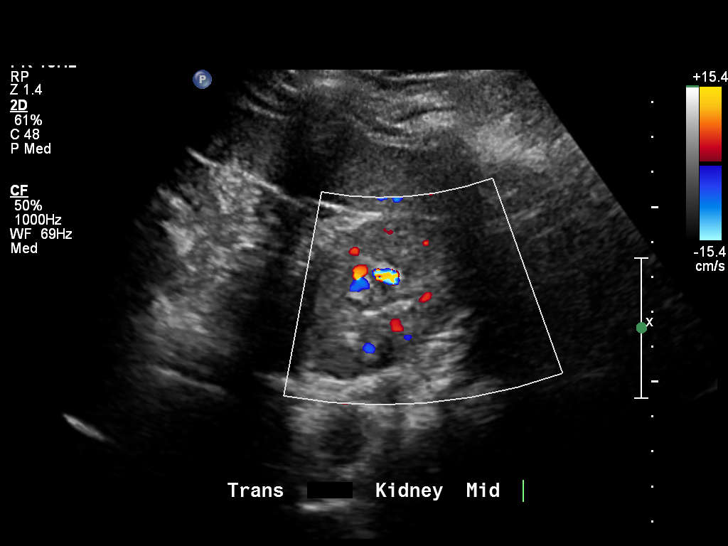
[im 39/59]
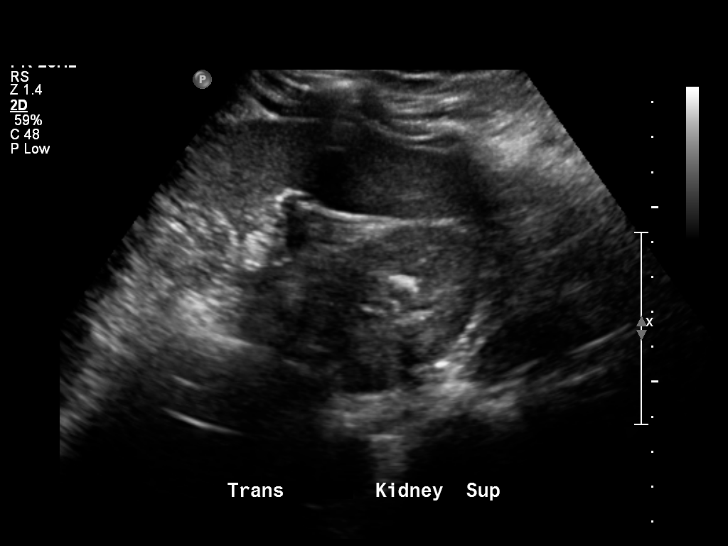
[im 44/59]
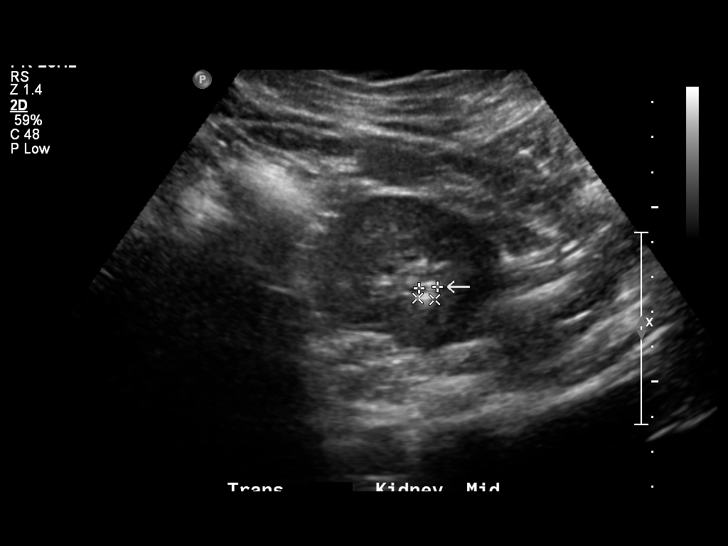
[im 49/59]
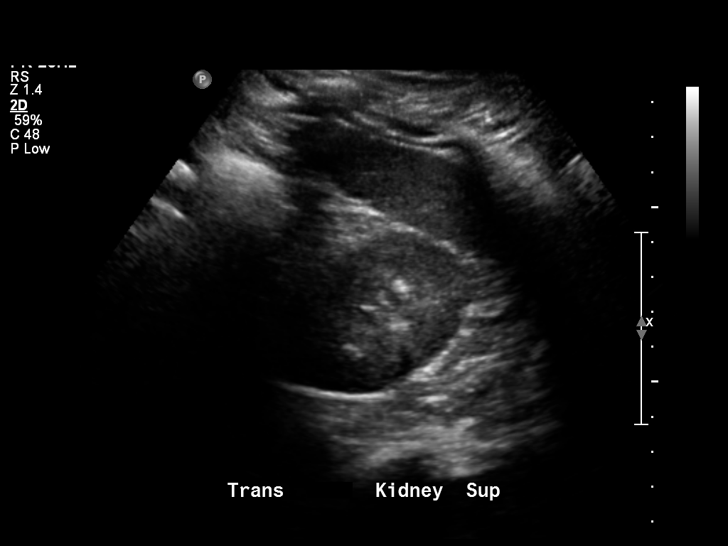
[im 54/59]
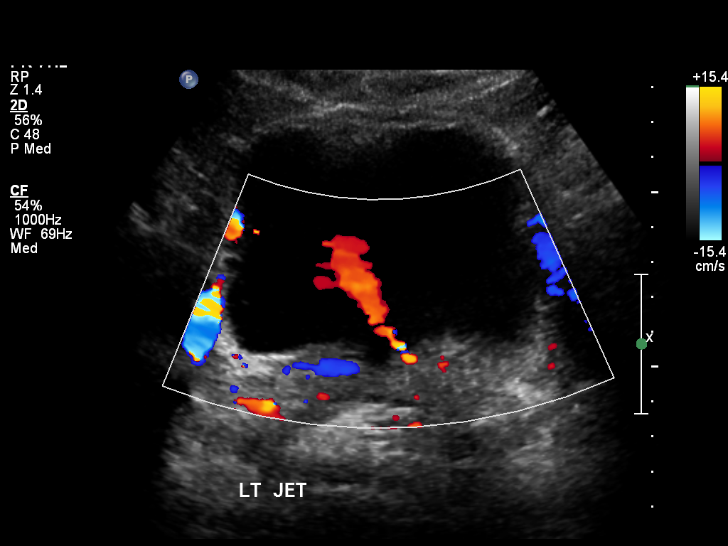
[im 59/59]
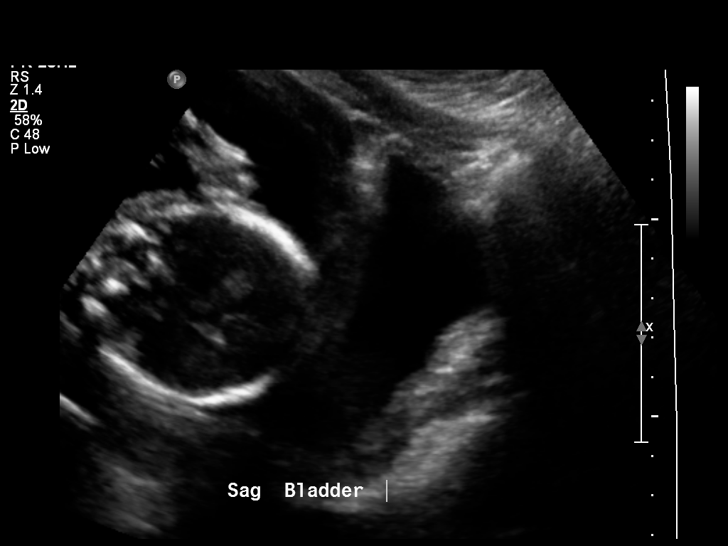

[14 of 25 positions shown; findings below may reference images not displayed]

FINDINGS: Right Kidney:  13.3 cm.  Nonobstructing right inner polar renal
collecting system calculus measuring 1 cm with "twinkle" artifact
on color Doppler compatible with a nonobstructing calculus.  Mild
dilation of the superior and inferior pole calyces compatible with
hydronephrosis of pregnancy. Right ureteral jet visualized.  No
obstructing pelvic or ureteral stone identified.

Left Kidney:  12.7 cm.  Multiple echogenic foci are present ranging
in size from 5 mm to 8 mm compatible with collecting system
calculi.  No pelvic or ureteral calculi are identified.  Left
ureteral jet visualized. No dilation of the left renal collecting
system.

Bladder:  Normal.  As above, both ureteral jets are visualized.
IMPRESSION: 1.  Bilateral nonobstructing renal collecting system calculi.
2.  Mild right hydronephrosis compatible with hydronephrosis of
pregnancy.  Bilateral ureteral jets are visualized and no renal
pelvis or proximal ureteral obstructing stones are identified.

## 2015-01-01 IMAGING — US US OB FOLLOW-UP
1 series · 12 of 28 positions shown · non-contrast
Comparison: none

[Series 1: us ob follow-up · 0.29mm/px · 12 of 38 slices shown]
[im 2/38]
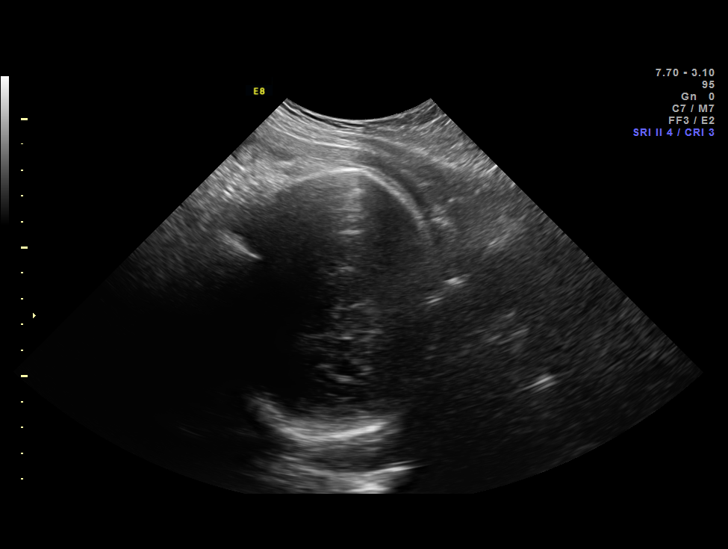
[im 5/38]
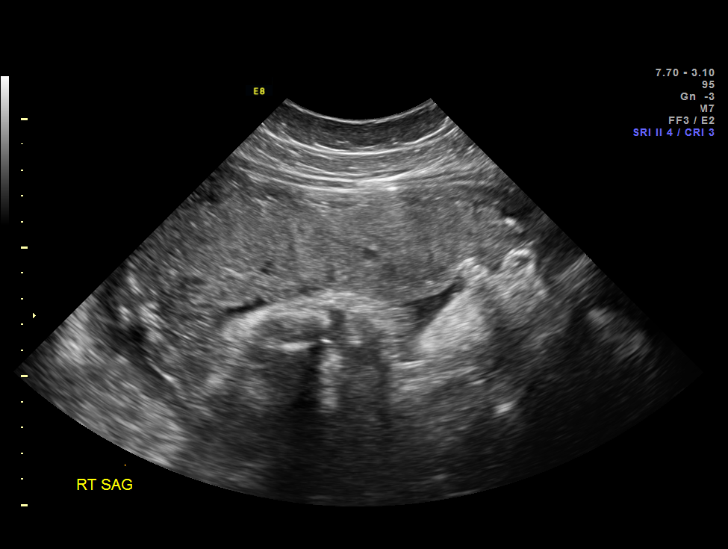
[im 7/38]
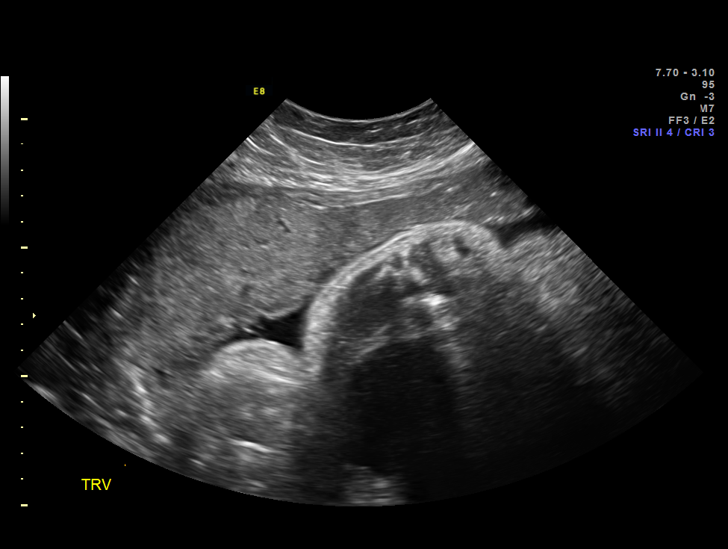
[im 11/38]
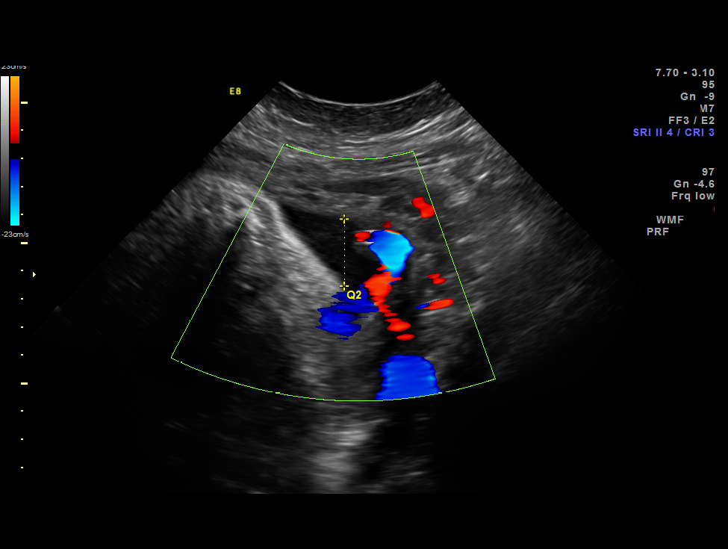
[im 14/38]
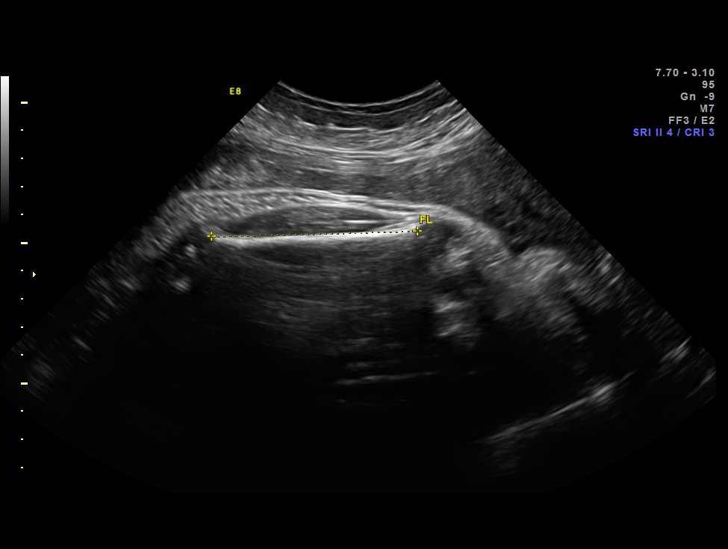
[im 17/38]
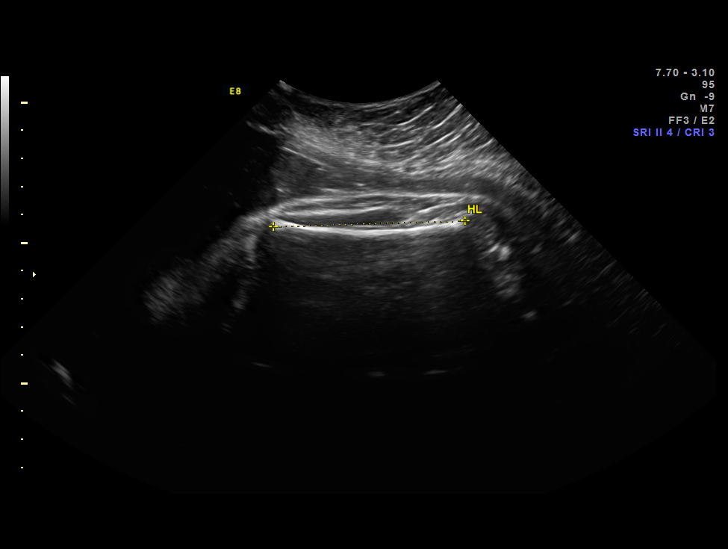
[im 21/38]
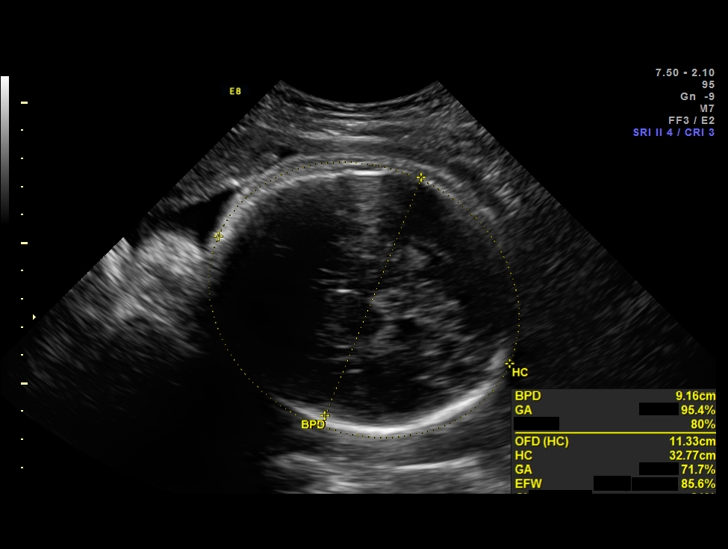
[im 24/38]
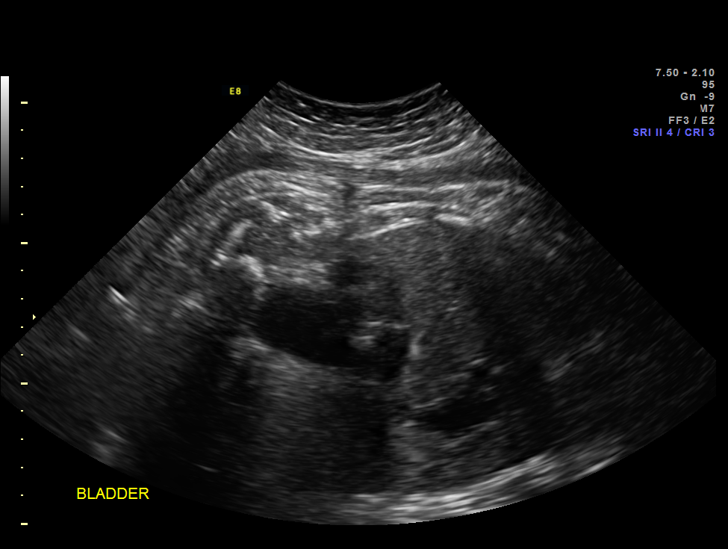
[im 27/38]
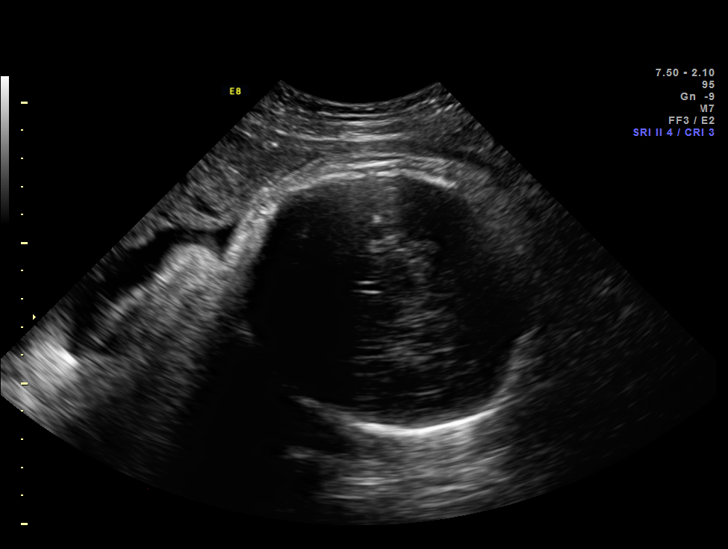
[im 31/38]
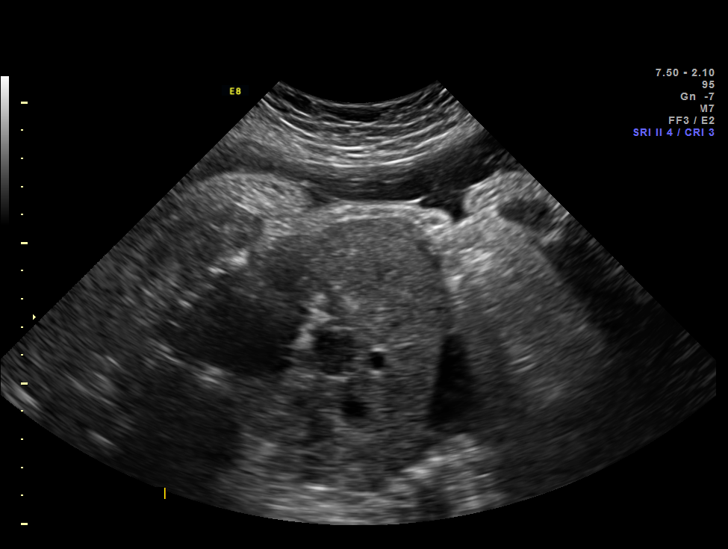
[im 33/38]
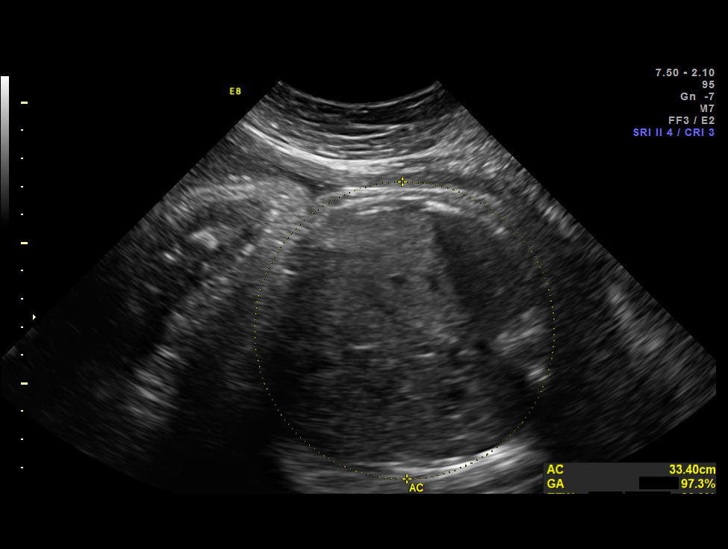
[im 36/38]
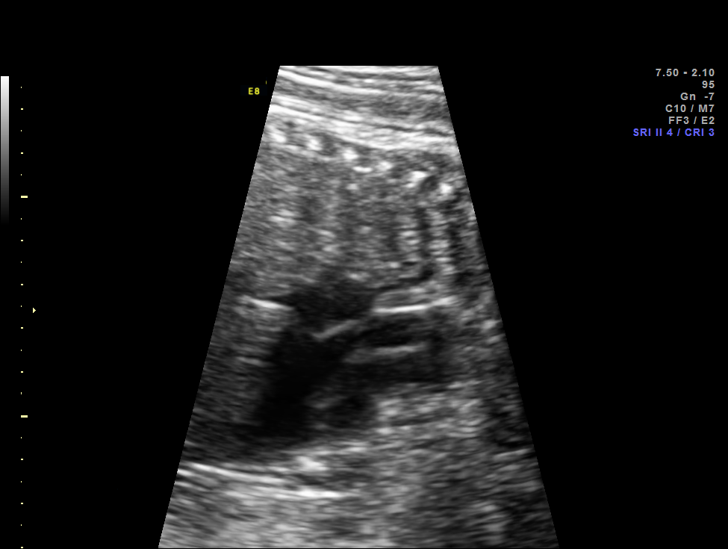

[12 of 28 positions shown; findings below may reference images not displayed]

OBSTETRICS REPORT
                      (Signed Final 05/01/2012 [DATE])

Service(s) Provided

 US OB FOLLOW UP                                       76816.1
Indications

 History of genetic / anatomic abnormality - sickle
 cell trait (pt & FOB)
 Diabetes - Gestational, A2 (medication controlled)
 Size greater than dates (Large for gestational [AGE]
 Macrosomia
 Obesity complicating pregnancy                        649.13,
Fetal Evaluation

 Num Of Fetuses:    1
 Fetal Heart Rate:  163                          bpm
 Cardiac Activity:  Observed
 Presentation:      Cephalic
 Placenta:          Anterior RT, above
                    cervical os

 Amniotic Fluid
 AFI FV:      Subjectively within normal limits
 AFI Sum:     10.09   cm       21  %Tile     Larg Pckt:    4.06  cm
 RUQ:   4.06    cm   LUQ:    2.39   cm    LLQ:   3.64    cm
Biometry

 BPD:     90.5  mm     G. Age:  36w 5d                CI:         79.7   70 - 86
 OFD:    113.5  mm                                    FL/HC:      22.5   20.1 -

 HC:     326.4  mm     G. Age:  37w 0d       66  %    HC/AC:      0.98   0.93 -

 AC:     334.5  mm     G. Age:  37w 2d     > 97  %    FL/BPD:     81.3   71 - 87
 FL:      73.6  mm     G. Age:  37w 5d       94  %    FL/AC:      22.0   20 - 24
 HUM:     67.6  mm     G. Age:  39w 2d     > 95  %

 Est. FW:    5138  gm           7 lb   > 90  %
Gestational Age

 LMP:           39w 2d        Date:  07/31/11                 EDD:   05/06/12
 U/S Today:     37w 1d                                        EDD:   05/21/12
 Best:          35w 0d     Det. By:  U/S C R L (10/14/11)     EDD:   06/05/12
Anatomy

 Cranium:          Appears normal         Aortic Arch:      Not well visualized
 Fetal Cavum:      Previously seen        Ductal Arch:      Previously seen
 Ventricles:       Appears normal         Diaphragm:        Appears normal
 Choroid Plexus:   Previously seen        Stomach:          Appears normal
 Cerebellum:       Previously seen        Abdomen:          Appears normal
 Posterior Fossa:  Appears normal         Abdominal Wall:   Previously seen
 Nuchal Fold:      Not applicable (>20    Cord Vessels:     Previously seen
                   wks GA)
 Face:             Orbits and profile     Kidneys:          Appear normal
                   previously seen
 Lips:             Appears normal         Bladder:          Appears normal
 Heart:            Appears normal         Spine:            Not well visualized
                   (4CH, axis, and
                   situs)
 RVOT:             Previously seen        Lower             Previously seen
                                          Extremities:
 LVOT:             Appears normal         Upper             Previously seen
                                          Extremities:

 Other:  Fetus appears to be a male. Nasal bone visualized. Technically
         difficult due to advanced GA and fetal position.
Cervix Uterus Adnexa

 Cervix:       Not visualized (advanced GA >34 wks)
 Left Ovary:    Previously seen.
 Right Ovary:   Previously seen
Impression

 Single IUP at 35 0/7 weeks
 A2 GDM on glyburide
 The estimated fetal weight today is > 90th %tile (5138 g); the
 AC is > 90th %tile
 Normal amnioitic fluid volume

 Reactive NST
Recommendations

 Follow-up ultrasounds as clinically indicated.
 Recommend twice weekly nonstress tests with amniotic fluid
 volume assessment.

## 2015-10-06 ENCOUNTER — Encounter: Payer: Self-pay | Admitting: Family Medicine
